# Patient Record
Sex: Female | Born: 1955 | Race: Black or African American | Hispanic: No | Marital: Married | Smoking: Current some day smoker
Health system: Southern US, Community
[De-identification: ages and names within clinical notes are randomized; demographics above are authoritative.]

## PROBLEM LIST (undated history)

## (undated) DIAGNOSIS — E7800 Pure hypercholesterolemia, unspecified: Secondary | ICD-10-CM

## (undated) DIAGNOSIS — M1990 Unspecified osteoarthritis, unspecified site: Secondary | ICD-10-CM

## (undated) DIAGNOSIS — N3281 Overactive bladder: Secondary | ICD-10-CM

## (undated) DIAGNOSIS — E559 Vitamin D deficiency, unspecified: Secondary | ICD-10-CM

## (undated) DIAGNOSIS — I10 Essential (primary) hypertension: Secondary | ICD-10-CM

## (undated) DIAGNOSIS — J40 Bronchitis, not specified as acute or chronic: Secondary | ICD-10-CM

## (undated) HISTORY — PX: FRACTURE SURGERY: SHX138

## (undated) HISTORY — PX: KNEE ARTHROSCOPY: SUR90

## (undated) HISTORY — PX: CARPAL TUNNEL RELEASE: SHX101

---

## 2004-07-31 ENCOUNTER — Ambulatory Visit: Payer: Self-pay | Admitting: Family Medicine

## 2007-05-27 ENCOUNTER — Ambulatory Visit: Payer: Self-pay | Admitting: Gastroenterology

## 2007-06-23 ENCOUNTER — Ambulatory Visit: Payer: Self-pay | Admitting: *Deleted

## 2007-07-04 ENCOUNTER — Ambulatory Visit: Payer: Self-pay | Admitting: *Deleted

## 2007-07-08 ENCOUNTER — Emergency Department: Payer: Self-pay | Admitting: Internal Medicine

## 2007-07-10 ENCOUNTER — Emergency Department: Payer: Self-pay | Admitting: Emergency Medicine

## 2010-11-24 ENCOUNTER — Ambulatory Visit: Payer: Self-pay | Admitting: Specialist

## 2010-12-14 ENCOUNTER — Ambulatory Visit: Payer: Self-pay | Admitting: Specialist

## 2011-01-25 ENCOUNTER — Ambulatory Visit: Payer: Self-pay | Admitting: Specialist

## 2012-07-09 ENCOUNTER — Ambulatory Visit: Payer: Self-pay | Admitting: Family Medicine

## 2014-08-31 ENCOUNTER — Ambulatory Visit
Admit: 2014-08-31 | Disposition: A | Discharge status: Home / Self Care | Payer: Self-pay | Attending: Hematology and Oncology | Admitting: Hematology and Oncology

## 2014-09-07 ENCOUNTER — Ambulatory Visit
Admit: 2014-09-07 | Disposition: A | Discharge status: Home / Self Care | Payer: Self-pay | Attending: Hematology and Oncology | Admitting: Hematology and Oncology

## 2014-10-01 ENCOUNTER — Ambulatory Visit
Admit: 2014-10-01 | Disposition: A | Discharge status: Home / Self Care | Payer: Self-pay | Attending: Hematology and Oncology | Admitting: Hematology and Oncology

## 2014-10-08 LAB — CBC CANCER CENTER
Basophil #: 0.1 x10 3/mm (ref 0.0–0.1)
Basophil %: 1 %
Eosinophil #: 0.4 x10 3/mm (ref 0.0–0.7)
Eosinophil %: 3.6 %
HCT: 45.5 % (ref 35.0–47.0)
HGB: 15.2 g/dL (ref 12.0–16.0)
Lymphocyte #: 2.8 x10 3/mm (ref 1.0–3.6)
Lymphocyte %: 27 %
MCH: 30.2 pg (ref 26.0–34.0)
MCHC: 33.5 g/dL (ref 32.0–36.0)
MCV: 90 fL (ref 80–100)
Monocyte #: 1 x10 3/mm — ABNORMAL HIGH (ref 0.2–0.9)
Monocyte %: 9.8 %
Neutrophil #: 6.1 x10 3/mm (ref 1.4–6.5)
Neutrophil %: 58.6 %
Platelet: 247 x10 3/mm (ref 150–440)
RBC: 5.05 10*6/uL (ref 3.80–5.20)
RDW: 13.4 % (ref 11.5–14.5)
WBC: 10.4 x10 3/mm (ref 3.6–11.0)

## 2014-10-08 LAB — URINALYSIS, COMPLETE
Bilirubin,UR: NEGATIVE
Glucose,UR: NEGATIVE mg/dL (ref 0–75)
Ketone: NEGATIVE
Leukocyte Esterase: NEGATIVE
Nitrite: NEGATIVE
Ph: 7 (ref 4.5–8.0)
Protein: 30
Specific Gravity: 1.008 (ref 1.003–1.030)

## 2016-01-06 ENCOUNTER — Other Ambulatory Visit: Payer: Self-pay | Admitting: Nurse Practitioner

## 2016-01-06 DIAGNOSIS — Z1231 Encounter for screening mammogram for malignant neoplasm of breast: Secondary | ICD-10-CM

## 2016-01-19 ENCOUNTER — Other Ambulatory Visit: Payer: Self-pay | Admitting: Nurse Practitioner

## 2016-01-19 ENCOUNTER — Ambulatory Visit
Admission: RE | Admit: 2016-01-19 | Discharge: 2016-01-19 | Disposition: A | Discharge status: Home / Self Care | Payer: BC Managed Care – PPO | Source: Ambulatory Visit | Attending: Nurse Practitioner | Admitting: Nurse Practitioner

## 2016-01-19 DIAGNOSIS — Z1231 Encounter for screening mammogram for malignant neoplasm of breast: Secondary | ICD-10-CM

## 2016-01-20 ENCOUNTER — Ambulatory Visit
Admission: RE | Admit: 2016-01-20 | Discharge: 2016-01-20 | Disposition: A | Discharge status: Home / Self Care | Payer: BC Managed Care – PPO | Source: Ambulatory Visit | Attending: Obstetrics & Gynecology | Admitting: Obstetrics & Gynecology

## 2016-01-20 ENCOUNTER — Other Ambulatory Visit: Payer: Self-pay

## 2016-01-20 ENCOUNTER — Encounter
Admission: RE | Admit: 2016-01-20 | Discharge: 2016-01-20 | Disposition: A | Discharge status: Home / Self Care | Payer: BC Managed Care – PPO | Source: Ambulatory Visit | Attending: Obstetrics & Gynecology | Admitting: Obstetrics & Gynecology

## 2016-01-20 DIAGNOSIS — Z01812 Encounter for preprocedural laboratory examination: Secondary | ICD-10-CM | POA: Insufficient documentation

## 2016-01-20 DIAGNOSIS — I10 Essential (primary) hypertension: Secondary | ICD-10-CM | POA: Insufficient documentation

## 2016-01-20 DIAGNOSIS — Z01810 Encounter for preprocedural cardiovascular examination: Secondary | ICD-10-CM | POA: Insufficient documentation

## 2016-01-20 DIAGNOSIS — N812 Incomplete uterovaginal prolapse: Secondary | ICD-10-CM | POA: Insufficient documentation

## 2016-01-20 DIAGNOSIS — E669 Obesity, unspecified: Secondary | ICD-10-CM | POA: Diagnosis not present

## 2016-01-20 DIAGNOSIS — N816 Rectocele: Secondary | ICD-10-CM | POA: Insufficient documentation

## 2016-01-20 DIAGNOSIS — N3946 Mixed incontinence: Secondary | ICD-10-CM | POA: Diagnosis present

## 2016-01-20 DIAGNOSIS — F17200 Nicotine dependence, unspecified, uncomplicated: Secondary | ICD-10-CM

## 2016-01-20 HISTORY — DX: Vitamin D deficiency, unspecified: E55.9

## 2016-01-20 HISTORY — DX: Essential (primary) hypertension: I10

## 2016-01-20 HISTORY — DX: Overactive bladder: N32.81

## 2016-01-20 HISTORY — DX: Bronchitis, not specified as acute or chronic: J40

## 2016-01-20 HISTORY — DX: Unspecified osteoarthritis, unspecified site: M19.90

## 2016-01-20 HISTORY — DX: Pure hypercholesterolemia, unspecified: E78.00

## 2016-01-20 LAB — TYPE AND SCREEN
ABO/RH(D): A POS
Antibody Screen: NEGATIVE

## 2016-01-20 LAB — CBC
HEMATOCRIT: 50 % — AB (ref 35.0–47.0)
Hemoglobin: 17 g/dL — ABNORMAL HIGH (ref 12.0–16.0)
MCH: 30.6 pg (ref 26.0–34.0)
MCHC: 34 g/dL (ref 32.0–36.0)
MCV: 90 fL (ref 80.0–100.0)
Platelets: 243 10*3/uL (ref 150–440)
RBC: 5.55 MIL/uL — AB (ref 3.80–5.20)
RDW: 14.6 % — ABNORMAL HIGH (ref 11.5–14.5)
WBC: 9.2 10*3/uL (ref 3.6–11.0)

## 2016-01-20 LAB — BASIC METABOLIC PANEL
ANION GAP: 11 (ref 5–15)
BUN: 14 mg/dL (ref 6–20)
CO2: 26 mmol/L (ref 22–32)
Calcium: 10.2 mg/dL (ref 8.9–10.3)
Chloride: 101 mmol/L (ref 101–111)
Creatinine, Ser: 0.92 mg/dL (ref 0.44–1.00)
GFR calc Af Amer: >60 mL/min (ref >60)
GLUCOSE: 93 mg/dL (ref 65–99)
POTASSIUM: 3.2 mmol/L — AB (ref 3.5–5.1)
Sodium: 138 mmol/L (ref 135–145)

## 2016-01-20 LAB — PROTIME-INR
INR: 1.01
Prothrombin Time: 13.5 seconds (ref 11.4–15.0)

## 2016-01-20 LAB — APTT: APTT: 37 s — AB (ref 24–36)

## 2016-01-20 NOTE — Pre-Procedure Instructions (Signed)
SEPTAL INFARCT NOTED ON PREVIOUS EKG FROM 2012. REVIEWED WITH DR Amie Critchley,. NO NEW ORDERS

## 2016-01-20 NOTE — Patient Instructions (Signed)
Your procedure is scheduled on: Thursday 01/26/16 Report to Day Surgery. 2ND FLOOR MEDICAL MALL ENTRANCE To find out your arrival time please call [phone] between 1PM - 3PM on Wednesday 01/25/16.  Remember: Instructions that are not followed completely may result in serious medical risk, up to and including death, or upon the discretion of your surgeon and anesthesiologist your surgery may need to be rescheduled.    __X__ 1. Do not eat food or drink liquids after midnight. No gum chewing or hard candies.     __X__ 2. No Alcohol for 24 hours before or after surgery.   ____ 3. Bring all medications with you on the day of surgery if instructed.    __X__ 4. Notify your doctor if there is any change in your medical condition     (cold, fever, infections).     Do not wear jewelry, make-up, hairpins, clips or nail polish.  Do not wear lotions, powders, or perfumes.   Do not shave 48 hours prior to surgery. Men may shave face and neck.  Do not bring valuables to the hospital.    [facility] is not responsible for any belongings or valuables.               Contacts, dentures or bridgework may not be worn into surgery.  Leave your suitcase in the car. After surgery it may be brought to your room.  For patients admitted to the hospital, discharge time is determined by your                treatment team.   Patients discharged the day of surgery will not be allowed to drive home.   Please read over the following fact sheets that you were given:   Surgical Site Infection Prevention   __X__ Take these medicines the morning of surgery with A SIP OF WATER:    1. ENALAPRIL  2.   3.   4.  5.  6.  ____ Fleet Enema (as directed)   __X__ Use CHG Soap as directed  __X__ Use inhalers on the day of surgery  ____ Stop metformin 2 days prior to surgery    ____ Take 1/2 of usual insulin dose the night before surgery and none on the morning of surgery.   __X__ Stop Coumadin/Plavix/aspirin on  TODAY  __X__ Stop Anti-inflammatories on STOP MELOXICAM TODAY MAY USE TYLENOL    ____ Stop supplements until after surgery.    ____ Bring C-Pap to the hospital.

## 2016-01-26 ENCOUNTER — Encounter
Admission: RE | Disposition: A | Discharge status: Home / Self Care | Payer: Self-pay | Source: Ambulatory Visit | Attending: Obstetrics & Gynecology

## 2016-01-26 ENCOUNTER — Ambulatory Visit
Admission: RE | Admit: 2016-01-26 | Discharge: 2016-01-26 | Disposition: A | Discharge status: Home / Self Care | Payer: BC Managed Care – PPO | Source: Ambulatory Visit | Attending: Obstetrics & Gynecology | Admitting: Obstetrics & Gynecology

## 2016-01-26 ENCOUNTER — Ambulatory Visit: Payer: BC Managed Care – PPO | Admitting: Anesthesiology

## 2016-01-26 ENCOUNTER — Encounter: Payer: Self-pay | Admitting: *Deleted

## 2016-01-26 DIAGNOSIS — I10 Essential (primary) hypertension: Secondary | ICD-10-CM | POA: Diagnosis not present

## 2016-01-26 DIAGNOSIS — N72 Inflammatory disease of cervix uteri: Secondary | ICD-10-CM | POA: Insufficient documentation

## 2016-01-26 DIAGNOSIS — N888 Other specified noninflammatory disorders of cervix uteri: Secondary | ICD-10-CM | POA: Insufficient documentation

## 2016-01-26 DIAGNOSIS — Z79899 Other long term (current) drug therapy: Secondary | ICD-10-CM | POA: Diagnosis not present

## 2016-01-26 DIAGNOSIS — M1990 Unspecified osteoarthritis, unspecified site: Secondary | ICD-10-CM | POA: Diagnosis not present

## 2016-01-26 DIAGNOSIS — R3915 Urgency of urination: Secondary | ICD-10-CM | POA: Diagnosis not present

## 2016-01-26 DIAGNOSIS — R350 Frequency of micturition: Secondary | ICD-10-CM | POA: Insufficient documentation

## 2016-01-26 DIAGNOSIS — J449 Chronic obstructive pulmonary disease, unspecified: Secondary | ICD-10-CM | POA: Diagnosis not present

## 2016-01-26 DIAGNOSIS — R351 Nocturia: Secondary | ICD-10-CM | POA: Insufficient documentation

## 2016-01-26 DIAGNOSIS — Z803 Family history of malignant neoplasm of breast: Secondary | ICD-10-CM | POA: Diagnosis not present

## 2016-01-26 DIAGNOSIS — N819 Female genital prolapse, unspecified: Secondary | ICD-10-CM | POA: Diagnosis present

## 2016-01-26 DIAGNOSIS — E559 Vitamin D deficiency, unspecified: Secondary | ICD-10-CM | POA: Insufficient documentation

## 2016-01-26 DIAGNOSIS — Z6832 Body mass index (BMI) 32.0-32.9, adult: Secondary | ICD-10-CM | POA: Diagnosis not present

## 2016-01-26 DIAGNOSIS — D252 Subserosal leiomyoma of uterus: Secondary | ICD-10-CM | POA: Diagnosis not present

## 2016-01-26 DIAGNOSIS — N814 Uterovaginal prolapse, unspecified: Secondary | ICD-10-CM | POA: Insufficient documentation

## 2016-01-26 DIAGNOSIS — N3281 Overactive bladder: Secondary | ICD-10-CM | POA: Insufficient documentation

## 2016-01-26 DIAGNOSIS — N393 Stress incontinence (female) (male): Secondary | ICD-10-CM | POA: Insufficient documentation

## 2016-01-26 DIAGNOSIS — E7800 Pure hypercholesterolemia, unspecified: Secondary | ICD-10-CM | POA: Diagnosis not present

## 2016-01-26 DIAGNOSIS — E669 Obesity, unspecified: Secondary | ICD-10-CM | POA: Diagnosis not present

## 2016-01-26 HISTORY — PX: CYSTOSCOPY: SHX5120

## 2016-01-26 HISTORY — PX: ANTERIOR AND POSTERIOR REPAIR WITH SACROSPINOUS FIXATION: SHX6536

## 2016-01-26 HISTORY — PX: VAGINAL HYSTERECTOMY: SHX2639

## 2016-01-26 HISTORY — PX: PUBOVAGINAL SLING: SHX1035

## 2016-01-26 LAB — POCT I-STAT 4, (NA,K, GLUC, HGB,HCT)
GLUCOSE: 101 mg/dL — AB (ref 65–99)
HEMATOCRIT: 48 % — AB (ref 36.0–46.0)
HEMOGLOBIN: 16.3 g/dL — AB (ref 12.0–15.0)
POTASSIUM: 4 mmol/L (ref 3.5–5.1)
SODIUM: 141 mmol/L (ref 135–145)

## 2016-01-26 SURGERY — HYSTERECTOMY, VAGINAL
Anesthesia: General

## 2016-01-26 MED ORDER — ACETAMINOPHEN 650 MG RE SUPP
650.0000 mg | RECTAL | Status: DC | PRN
  Filled 2016-01-26: qty 1

## 2016-01-26 MED ORDER — PROPOFOL 10 MG/ML IV BOLUS
INTRAVENOUS | Status: DC | PRN
  Administered 2016-01-26: 120 mg via INTRAVENOUS

## 2016-01-26 MED ORDER — OXYCODONE HCL 5 MG PO TABS
5.0000 mg | ORAL_TABLET | Freq: Once | ORAL | Status: AC | PRN
  Administered 2016-01-26: 5 mg via ORAL

## 2016-01-26 MED ORDER — MEPERIDINE HCL 25 MG/ML IJ SOLN: 6.2500 mg | INTRAMUSCULAR | Status: DC | PRN

## 2016-01-26 MED ORDER — SULFANILAMIDE 15 % VA CREA
TOPICAL_CREAM | VAGINAL | Status: DC | PRN
  Administered 2016-01-26: 1 via VAGINAL

## 2016-01-26 MED ORDER — KETOROLAC TROMETHAMINE 30 MG/ML IJ SOLN
30.0000 mg | Freq: Four times a day (QID) | INTRAMUSCULAR | Status: DC
  Filled 2016-01-26 (×6): qty 1

## 2016-01-26 MED ORDER — FENTANYL CITRATE (PF) 100 MCG/2ML IJ SOLN
25.0000 ug | INTRAMUSCULAR | Status: DC | PRN
  Administered 2016-01-26 (×2): 25 ug via INTRAVENOUS

## 2016-01-26 MED ORDER — DEXAMETHASONE SODIUM PHOSPHATE 10 MG/ML IJ SOLN
INTRAMUSCULAR | Status: DC | PRN
  Administered 2016-01-26: 5 mg via INTRAVENOUS

## 2016-01-26 MED ORDER — ACETAMINOPHEN 10 MG/ML IV SOLN
INTRAVENOUS | Status: DC | PRN
  Administered 2016-01-26: 1000 mg via INTRAVENOUS

## 2016-01-26 MED ORDER — OXYCODONE HCL 5 MG/5ML PO SOLN: 5.0000 mg | Freq: Once | ORAL | Status: AC | PRN

## 2016-01-26 MED ORDER — OXYCODONE-ACETAMINOPHEN 5-325 MG PO TABS: 1.0000 | ORAL_TABLET | ORAL | 0 refills | Status: AC | PRN

## 2016-01-26 MED ORDER — CEFOXITIN SODIUM-DEXTROSE 2-2.2 GM-% IV SOLR (PREMIX)
2.0000 g | Freq: Once | INTRAVENOUS | Status: AC
  Administered 2016-01-26: 2 g via INTRAVENOUS

## 2016-01-26 MED ORDER — MIDAZOLAM HCL 2 MG/2ML IJ SOLN
INTRAMUSCULAR | Status: DC | PRN
  Administered 2016-01-26: 2 mg via INTRAVENOUS

## 2016-01-26 MED ORDER — CEFOXITIN SODIUM-DEXTROSE 2-2.2 GM-% IV SOLR (PREMIX)
INTRAVENOUS | Status: AC
  Filled 2016-01-26: qty 50

## 2016-01-26 MED ORDER — SULFANILAMIDE 15 % VA CREA
TOPICAL_CREAM | VAGINAL | Status: AC
  Filled 2016-01-26: qty 120

## 2016-01-26 MED ORDER — LACTATED RINGERS IV SOLN
INTRAVENOUS | Status: DC
  Administered 2016-01-26 (×3): via INTRAVENOUS

## 2016-01-26 MED ORDER — FAMOTIDINE 20 MG PO TABS
ORAL_TABLET | ORAL | Status: AC
  Administered 2016-01-26: 20 mg via ORAL
  Filled 2016-01-26: qty 1

## 2016-01-26 MED ORDER — FENTANYL CITRATE (PF) 100 MCG/2ML IJ SOLN
INTRAMUSCULAR | Status: AC
  Administered 2016-01-26: 25 ug via INTRAVENOUS
  Filled 2016-01-26: qty 2

## 2016-01-26 MED ORDER — ACETAMINOPHEN 10 MG/ML IV SOLN
INTRAVENOUS | Status: AC
  Filled 2016-01-26: qty 100

## 2016-01-26 MED ORDER — LIDOCAINE-EPINEPHRINE 1 %-1:100000 IJ SOLN
INTRAMUSCULAR | Status: DC | PRN
  Administered 2016-01-26: 40 mL

## 2016-01-26 MED ORDER — ACETAMINOPHEN 325 MG PO TABS: 650.0000 mg | ORAL_TABLET | ORAL | Status: DC | PRN

## 2016-01-26 MED ORDER — PROMETHAZINE HCL 25 MG/ML IJ SOLN: 6.2500 mg | INTRAMUSCULAR | Status: DC | PRN

## 2016-01-26 MED ORDER — FAMOTIDINE 20 MG PO TABS
20.0000 mg | ORAL_TABLET | Freq: Once | ORAL | Status: AC
  Administered 2016-01-26: 20 mg via ORAL

## 2016-01-26 MED ORDER — LIDOCAINE-EPINEPHRINE 1 %-1:100000 IJ SOLN
INTRAMUSCULAR | Status: AC
  Filled 2016-01-26: qty 2

## 2016-01-26 MED ORDER — ONDANSETRON HCL 4 MG/2ML IJ SOLN
INTRAMUSCULAR | Status: DC | PRN
  Administered 2016-01-26: 4 mg via INTRAVENOUS

## 2016-01-26 MED ORDER — MORPHINE SULFATE (PF) 2 MG/ML IV SOLN: 1.0000 mg | INTRAVENOUS | Status: DC | PRN

## 2016-01-26 MED ORDER — OXYCODONE HCL 5 MG PO TABS
ORAL_TABLET | ORAL | Status: AC
  Filled 2016-01-26: qty 1

## 2016-01-26 MED ORDER — KETOROLAC TROMETHAMINE 30 MG/ML IJ SOLN
INTRAMUSCULAR | Status: DC | PRN
  Administered 2016-01-26: 30 mg via INTRAVENOUS

## 2016-01-26 MED ORDER — FENTANYL CITRATE (PF) 100 MCG/2ML IJ SOLN
INTRAMUSCULAR | Status: DC | PRN
  Administered 2016-01-26: 50 ug via INTRAVENOUS
  Administered 2016-01-26: 100 ug via INTRAVENOUS
  Administered 2016-01-26 (×2): 50 ug via INTRAVENOUS

## 2016-01-26 MED ORDER — LIDOCAINE HCL (CARDIAC) 20 MG/ML IV SOLN
INTRAVENOUS | Status: DC | PRN
  Administered 2016-01-26: 30 mg via INTRAVENOUS

## 2016-01-26 SURGICAL SUPPLY — 62 items
BAG URINE DRAINAGE (UROLOGICAL SUPPLIES) ×4 IMPLANT
BAG URO DRAIN 2000ML W/SPOUT (MISCELLANEOUS) ×4 IMPLANT
BLADE SURG 15 STRL LF DISP TIS (BLADE) ×2 IMPLANT
BLADE SURG 15 STRL SS (BLADE) ×2
BLADE SURG SZ10 CARB STEEL (BLADE) IMPLANT
BNDG GAUZE 4.5X4.1 6PLY STRL (MISCELLANEOUS) IMPLANT
CANISTER SUCT 1200ML W/VALVE (MISCELLANEOUS) ×4 IMPLANT
CATH FOLEY 2WAY  5CC 16FR (CATHETERS) ×2
CATH ROBINSON RED A/P 16FR (CATHETERS) IMPLANT
CATH URTH 16FR FL 2W BLN LF (CATHETERS) ×2 IMPLANT
DRAPE LAPAROTOMY 100X77 ABD (DRAPES) IMPLANT
DRAPE PERI LITHO V/GYN (MISCELLANEOUS) ×4 IMPLANT
DRAPE SHEET LG 3/4 BI-LAMINATE (DRAPES) ×4 IMPLANT
DRAPE UNDER BUTTOCK W/FLU (DRAPES) ×4 IMPLANT
ELECT CAUTERY BLADE 6.4 (BLADE) IMPLANT
ELECT REM PT RETURN 9FT ADLT (ELECTROSURGICAL) ×4
ELECTRODE REM PT RTRN 9FT ADLT (ELECTROSURGICAL) ×2 IMPLANT
GAUZE PACK 2X3YD (MISCELLANEOUS) ×4 IMPLANT
GLOVE BIO SURGEON STRL SZ8 (GLOVE) ×32 IMPLANT
GOWN STRL REUS W/ TWL LRG LVL3 (GOWN DISPOSABLE) ×6 IMPLANT
GOWN STRL REUS W/ TWL XL LVL3 (GOWN DISPOSABLE) ×2 IMPLANT
GOWN STRL REUS W/TWL LRG LVL3 (GOWN DISPOSABLE) ×6
GOWN STRL REUS W/TWL XL LVL3 (GOWN DISPOSABLE) ×2
IV LACTATED RINGERS 1000ML (IV SOLUTION) ×4 IMPLANT
KIT RM TURNOVER CYSTO AR (KITS) ×4 IMPLANT
KIT RM TURNOVER STRD PROC AR (KITS) IMPLANT
LABEL OR SOLS (LABEL) ×4 IMPLANT
LIQUID BAND (GAUZE/BANDAGES/DRESSINGS) ×4 IMPLANT
NDL HPO THNWL 1X22GA REG BVL (NEEDLE) ×2 IMPLANT
NDL MAYO CATGUT SZ4 (NEEDLE) IMPLANT
NDL SAFETY 18GX1.5 (NEEDLE) IMPLANT
NDL SAFETY 22GX1.5 (NEEDLE) ×4 IMPLANT
NEEDLE SAFETY 22GX1 (NEEDLE) ×2
NEEDLE SPNL 22GX3.5 QUINCKE BK (NEEDLE) ×4 IMPLANT
NS IRRIG 500ML POUR BTL (IV SOLUTION) ×4 IMPLANT
PACK BASIN MINOR ARMC (MISCELLANEOUS) ×4 IMPLANT
PAD OB MATERNITY 4.3X12.25 (PERSONAL CARE ITEMS) ×4 IMPLANT
PAD PREP 24X41 OB/GYN DISP (PERSONAL CARE ITEMS) ×4 IMPLANT
SET CYSTO W/LG BORE CLAMP LF (SET/KITS/TRAYS/PACK) ×4 IMPLANT
SLING TVT EXACT (Sling) ×4 IMPLANT
SOL PREP PVP 2OZ (MISCELLANEOUS) ×4
SOLUTION PREP PVP 2OZ (MISCELLANEOUS) ×2 IMPLANT
SPONGE XRAY 4X4 16PLY STRL (MISCELLANEOUS) ×8 IMPLANT
STRAP SAFETY BODY (MISCELLANEOUS) ×4 IMPLANT
SURGILUBE 2OZ TUBE FLIPTOP (MISCELLANEOUS) ×4 IMPLANT
SUT ETHIBOND NAB CT1 #1 30IN (SUTURE) ×8 IMPLANT
SUT VIC AB 0 CT1 27 (SUTURE) ×4
SUT VIC AB 0 CT1 27XCR 8 STRN (SUTURE) ×4 IMPLANT
SUT VIC AB 0 CT1 36 (SUTURE) ×8 IMPLANT
SUT VIC AB 1 CT1 36 (SUTURE) ×4 IMPLANT
SUT VIC AB 2-0 CT1 (SUTURE) IMPLANT
SUT VIC AB 2-0 CT1 27 (SUTURE)
SUT VIC AB 2-0 CT1 TAPERPNT 27 (SUTURE) IMPLANT
SUT VIC AB 3-0 SH 27 (SUTURE)
SUT VIC AB 3-0 SH 27X BRD (SUTURE) IMPLANT
SUT VICRYL+ 3-0 36IN CT-1 (SUTURE) IMPLANT
SYR BULB IRRIG 60ML STRL (SYRINGE) ×4 IMPLANT
SYR CONTROL 10ML (SYRINGE) ×4 IMPLANT
SYRINGE 10CC LL (SYRINGE) ×4 IMPLANT
SYRINGE IRR TOOMEY STRL 70CC (SYRINGE) ×4 IMPLANT
TUBING CONNECTING 10 (TUBING) ×3 IMPLANT
TUBING CONNECTING 10' (TUBING) ×1

## 2016-01-26 NOTE — Transfer of Care (Signed)
Immediate Anesthesia Transfer of Care Note  Patient: Alison Gonzalez  Procedure(s) Performed: Procedure(s): HYSTERECTOMY VAGINAL/ BSO (Bilateral) ANTERIOR AND POSTERIOR REPAIR (N/A) PUBO-VAGINAL SLING (N/A) CYSTOSCOPY (N/A)  Patient Location: PACU  Anesthesia Type:General  Level of Consciousness: awake, alert  and oriented  Airway & Oxygen Therapy: Patient Spontanous Breathing and Patient connected to face mask oxygen  Post-op Assessment: Report given to RN and Post -op Vital signs reviewed and stable  Post vital signs: Reviewed and stable  Last Vitals:  Vitals:   01/26/16 1107  BP: (!) 134/95  Pulse: 84  Resp: 16  Temp: 36.9 C    Last Pain:  Vitals:   01/26/16 1107  TempSrc: Oral         Complications: No apparent anesthesia complications

## 2016-01-26 NOTE — OR Nursing (Signed)
Foley catheter removed without difficulty after instilling 3100ml normal saline into bladder per Dr Kenton Kingfisher.

## 2016-01-26 NOTE — Discharge Instructions (Signed)
AMBULATORY SURGERY  DISCHARGE INSTRUCTIONS   1) The drugs that you were given will stay in your system until tomorrow so for the next 24 hours you should not:  A) Drive an automobile B) Make any legal decisions C) Drink any alcoholic beverage   2) You may resume regular meals tomorrow.  Today it is better to start with liquids and gradually work up to solid foods.  You may eat anything you prefer, but it is better to start with liquids, then soup and crackers, and gradually work up to solid foods.   3) Please notify your doctor immediately if you have any unusual bleeding, trouble breathing, redness and pain at the surgery site, drainage, fever, or pain not relieved by medication.    4) Additional Instructions:        Please contact your physician with any problems or Same Day Surgery at [phone], Monday through Friday 6 am to 4 pm, or [place] [facility] number at [phone].Hysterectomy Information  A hysterectomy is a surgery in which your uterus is removed. This surgery may be done to treat various medical problems. After the surgery, you will no longer have menstrual periods. The surgery will also make you unable to become pregnant (sterile). The fallopian tubes and ovaries can be removed (bilateral salpingo-oophorectomy) during this surgery as well.  REASONS FOR A HYSTERECTOMY  Persistent, abnormal bleeding.  Lasting (chronic) pelvic pain or infection.  The lining of the uterus (endometrium) starts growing outside the uterus (endometriosis).  The endometrium starts growing in the muscle of the uterus (adenomyosis).  The uterus falls down into the vagina (pelvic organ prolapse).  Noncancerous growths in the uterus (uterine fibroids) that cause symptoms.  Precancerous cells.  Cervical cancer or uterine cancer. TYPES OF HYSTERECTOMIES  Supracervical hysterectomy--In this type, the top part of the uterus is removed, but not the cervix.  Total  hysterectomy--The uterus and cervix are removed.  Radical hysterectomy--The uterus, the cervix, and the fibrous tissue that holds the uterus in place in the pelvis (parametrium) are removed. WAYS A HYSTERECTOMY CAN BE PERFORMED  Abdominal hysterectomy--A large surgical cut (incision) is made in the abdomen. The uterus is removed through this incision.  Vaginal hysterectomy--An incision is made in the vagina. The uterus is removed through this incision. There are no abdominal incisions.  Conventional laparoscopic hysterectomy--Three or four small incisions are made in the abdomen. A thin, lighted tube with a camera (laparoscope) is inserted into one of the incisions. Other tools are put through the other incisions. The uterus is cut into small pieces. The small pieces are removed through the incisions, or they are removed through the vagina.  Laparoscopically assisted vaginal hysterectomy (LAVH)--Three or four small incisions are made in the abdomen. Part of the surgery is performed laparoscopically and part vaginally. The uterus is removed through the vagina.  Robot-assisted laparoscopic hysterectomy--A laparoscope and other tools are inserted into 3 or 4 small incisions in the abdomen. A computer-controlled device is used to give the surgeon a 3D image and to help control the surgical instruments. This allows for more precise movements of surgical instruments. The uterus is cut into small pieces and removed through the incisions or removed through the vagina. RISKS AND COMPLICATIONS  Possible complications associated with this procedure include:  Bleeding and risk of blood transfusion. Tell your health care provider if you do not want to receive any blood products.  Blood clots in the legs or lung.  Infection.  Injury to surrounding organs.  Problems or side effects related to anesthesia.  Conversion to an abdominal hysterectomy from one of the other techniques. WHAT TO EXPECT AFTER A  HYSTERECTOMY  You will be given pain medicine.  You will need to have someone with you for the first 3-5 days after you go home.  You will need to follow up with your surgeon in 2-4 weeks after surgery to evaluate your progress.  You may have early menopause symptoms such as hot flashes, night sweats, and insomnia.  If you had a hysterectomy for a problem that was not cancer or not a condition that could lead to cancer, then you no longer need Pap tests. However, even if you no longer need a Pap test, a regular exam is a good idea to make sure no other problems are starting.   This information is not intended to replace advice given to you by your health care provider. Make sure you discuss any questions you have with your health care provider.   Document Released: 12/12/2000 Document Revised: 04/08/2013 Document Reviewed: 02/23/2013 Elsevier Interactive Patient Education [employer].

## 2016-01-26 NOTE — Anesthesia Preprocedure Evaluation (Signed)
Anesthesia Evaluation  Patient identified by MRN, date of birth, ID band Patient awake    Reviewed: Allergy & Precautions, NPO status , Patient's Chart, lab work & pertinent test results  History of Anesthesia Complications Negative for: history of anesthetic complications  Airway Mallampati: I  TM Distance: >3 FB Neck ROM: Full    Dental  (+) Missing, Poor Dentition   Pulmonary COPD,  COPD inhaler, Current Smoker,    breath sounds clear to auscultation- rhonchi (-) wheezing      Cardiovascular Exercise Tolerance: Good hypertension, Pt. on medications (-) CAD and (-) Past MI  Rhythm:Regular Rate:Normal - Systolic murmurs and - Diastolic murmurs    Neuro/Psych negative psych ROS   GI/Hepatic negative GI ROS, Neg liver ROS,   Endo/Other  negative endocrine ROSneg diabetes  Renal/GU negative Renal ROS     Musculoskeletal  (+) Arthritis , Osteoarthritis,    Abdominal (+) + obese,   Peds  Hematology negative hematology ROS (+)   Anesthesia Other Findings Past Medical History: No date: Arthritis No date: Bronchitis No date: Hypercholesteremia No date: Hypertension No date: Overactive bladder No date: Vitamin D deficiency   Reproductive/Obstetrics                             Anesthesia Physical Anesthesia Plan  ASA: II  Anesthesia Plan: General   Post-op Pain Management:    Induction: Intravenous  Airway Management Planned: LMA  Additional Equipment:   Intra-op Plan:   Post-operative Plan:   Informed Consent: I have reviewed the patients History and Physical, chart, labs and discussed the procedure including the risks, benefits and alternatives for the proposed anesthesia with the patient or authorized representative who has indicated his/her understanding and acceptance.   Dental advisory given  Plan Discussed with: Anesthesiologist and CRNA  Anesthesia Plan Comments:          Anesthesia Quick Evaluation

## 2016-01-26 NOTE — H&P (Signed)
History and Physical Interval Note:  01/26/2016 11:51 AM  Alison Gonzalez  has presented today for surgery, with the diagnosis of PELVIC ORGAN PROLAPSE,STRESS URINARY INCONTINENCE,CYSTOCELE,RECTOCELE  The various methods of treatment have been discussed with the patient and family. After consideration of risks, benefits and other options for treatment, the patient has consented to  Procedure(s): HYSTERECTOMY VAGINAL/ BSO (Bilateral) ANTERIOR AND POSTERIOR REPAIR (N/A) PUBO-VAGINAL SLING (N/A) CYSTOSCOPY (N/A) as a surgical intervention .  The patient's history has been reviewed, patient examined, no change in status, stable for surgery.  Pt has the following beta blocker history-  Not taking Beta Blocker.  I have reviewed the patient's chart and labs.  Questions were answered to the patient's satisfaction.    Hoyt Koch

## 2016-01-26 NOTE — OR Nursing (Signed)
Pt voided 371ml clear yellow urine.

## 2016-01-26 NOTE — Op Note (Signed)
Preoperative diagnosis: Pelvic Organ Prolapse, Stress Urinary Incontinence, Cystocele, Rectocele  Postoperative diagnosis: Same  Procedure: Transvaginal hysterectomy, Bilateral Salpingo-Oophorectomy, Anterior and Posterior Repair, Cystoscopy, Vaginal Sling Procedure (TVT)  Surgeon: Barnett Applebaum, M.D.  Anesthesia: general  Findings: Small uterus with fibroid, atrophic ovaries, prolapse and cystocele/rectocele.  Estimated blood loss: 200 cc  Specimen: Uterus and adnexa to pathology   Disposition: Tolerated procedure well  Procedure: Patient was taken to the OR where she was placed in dorsal lithotomy in [place]. She was prepped and draped in the usual sterile fashion. A timeout was performed. Foley is placed into bladder. A speculum was placed inside the vagina. The cervix was visualized and grasped with a tenaculum. 1% lidocaine with epinephrine were injected paracervically. A bovie was used to make a circumferential incision around the vagina. An opened sponge was used to dissect the vagina off the cervix. The posterior peritoneum was entered sharply with Mayo scissors.  The anterior peritoneal cavity was entered sharply with careful dissection of the bladder off the underlying cervix. A Heaney clamp was used to clamp first the left uterosacral ligament and cardinal which was then cut and Haney suture ligated with 0 Vicryl stitch, the stitch was held and later attached to the vaginal mucosa. Similarly the right uterosacral ligament was clamped cut and suture ligated.  Sequential clamping, transecting and suture ligating up the broad to the uterine arteries were taken until the tubo-ovarian pedicles were encountered. The uterus was then amputated. The left utero-ovarian pedicle grasped with a Heaney clamp. The right utero-ovarian pedicle was similarly grasped with the Heaney clamp. The right left ovaries were easily visualized. The left ovary and tube were grasped with Babcock clamp and a  Heaney clamp placed behind this. Tube and ovary were removed and the IP ligated with a free tie followed by suture ligature. The right tube and ovary were similarly grasped with a Babcock clamp and a Heaney clamp used to clamp behind this. The tube and ovary were removed on the side and the IP was ligated with a free tie followed by suture ligature. Inspection of all pedicles revealed adequate hemostasis.  The peritoneum was then closed with a running pursestring suture of 0 Vicryl. The uterosacrals were plicated using a 1-0 Ethibond suture.  Vagina is irrigated. Anterior colporrhaphy is performed.  Allis clamps are placed along the anterior vaginal wall, lidocaine is used to infiltrate the plane, and incision is made midline vertical.  Endopelvic fascia is dissected free of vaginal mucosa.   Before finishing the anterior repair, a vaginal sling procedure is placed.  A rigid cathater guide is placed in the bladder to deviate the bladder to the contralateral side of trocar placement. Lidocaine is used to infiltrate the tract.  The TVT trocars are then placed thru the anterior vaginal incision and thru the retropubic space along the undersurface of the pubic rami to exit the skin in the mons pubis.  Cystoscopy confirms no bladder injury.  Foley replaced.  Sling placed in supportive position without tension.  Fascia is plicated w interrupted vicryl sutures.  Excess mucosa is excised.  Vaginal incision is closed with a running locking Vicryl suture, to incoprate the hysterectomy incision as well, with care taken to incorporate the uterosacral pedicles.  Posterior repair done with incision in midline and dissection of fascia from mucosa.  Perineum repaired.  Mucosa excised.  Plication sutures placed.  Running closure with vicryl including perineal repair. Excellent hemostasis was noted at the end of the case. The  vaginal cuff was inspected there was minimal bleeding noted.  Packing gauze AVC cream is placed. A  Foley catheter is left in  place inside her bladder. Clear, yellow urine was noted. All instrument needle and lap counts were correct x 2. Patient was awakened taken to recovery room in stable condition.  Hoyt Koch, MD 01/26/2016, 2:47 PM

## 2016-01-27 ENCOUNTER — Encounter: Payer: Self-pay | Admitting: Obstetrics & Gynecology

## 2016-01-27 NOTE — Anesthesia Postprocedure Evaluation (Signed)
Anesthesia Post Note  Patient: Alison Gonzalez  Procedure(s) Performed: Procedure(s) (LRB): HYSTERECTOMY VAGINAL/ BSO (Bilateral) ANTERIOR AND POSTERIOR REPAIR (N/A) PUBO-VAGINAL SLING (N/A) CYSTOSCOPY (N/A)  Patient location during evaluation: PACU Anesthesia Type: General Level of consciousness: awake and alert Pain management: pain level controlled Vital Signs Assessment: post-procedure vital signs reviewed and stable Respiratory status: spontaneous breathing, nonlabored ventilation and respiratory function stable Cardiovascular status: blood pressure returned to baseline and stable Postop Assessment: no signs of nausea or vomiting Anesthetic complications: no    Last Vitals:  Vitals:   01/26/16 1718 01/26/16 1753  BP: 111/70 123/80  Pulse: 90   Resp: 16 16  Temp:      Last Pain:  Vitals:   01/27/16 0838  TempSrc:   PainSc: 5                  Terrall Bley

## 2016-01-30 LAB — SURGICAL PATHOLOGY

## 2016-12-24 ENCOUNTER — Other Ambulatory Visit: Payer: Self-pay | Admitting: Nurse Practitioner

## 2016-12-24 DIAGNOSIS — Z1231 Encounter for screening mammogram for malignant neoplasm of breast: Secondary | ICD-10-CM

## 2017-01-21 ENCOUNTER — Ambulatory Visit
Admission: RE | Admit: 2017-01-21 | Discharge: 2017-01-21 | Disposition: A | Discharge status: Home / Self Care | Payer: BC Managed Care – PPO | Source: Ambulatory Visit | Attending: Nurse Practitioner | Admitting: Nurse Practitioner

## 2017-01-21 DIAGNOSIS — Z1231 Encounter for screening mammogram for malignant neoplasm of breast: Secondary | ICD-10-CM | POA: Diagnosis present

## 2017-02-12 ENCOUNTER — Encounter: Payer: Self-pay | Admitting: Obstetrics & Gynecology

## 2017-02-12 ENCOUNTER — Ambulatory Visit (INDEPENDENT_AMBULATORY_CARE_PROVIDER_SITE_OTHER): Payer: BC Managed Care – PPO | Admitting: Obstetrics & Gynecology

## 2017-02-12 VITALS — BP 140/80 | HR 83 | Ht 65.0 in | Wt 183.0 lb

## 2017-02-12 DIAGNOSIS — R3915 Urgency of urination: Secondary | ICD-10-CM | POA: Insufficient documentation

## 2017-02-12 DIAGNOSIS — Z01419 Encounter for gynecological examination (general) (routine) without abnormal findings: Secondary | ICD-10-CM

## 2017-02-12 DIAGNOSIS — Z0000 Encounter for general adult medical examination without abnormal findings: Secondary | ICD-10-CM

## 2017-02-12 NOTE — Progress Notes (Signed)
HPI:      [name] is a 61 y.o. G2P2002 who LMP was in the past, she presents today for her annual examination.  The patient has no complaints today. The patient is not sexually active. Herlast mammogram: was normal.  The patient does perform self breast exams.  There is no notable family history of breast or ovarian cancer in her family. The patient is not taking hormone replacement therapy. Patient denies post-menopausal vaginal bleeding.   The patient has regular exercise: yes. The patient denies current symptoms of depression.  Occas urinary urgency, no nocturia or leakage.    GYN Hx: Last Colonoscopy:8 yrs ago. Normal.  Last DEXA: never ago.    PMHx: Past Medical History:  Diagnosis Date  . Arthritis   . Bronchitis   . Hypercholesteremia   . Hypertension   . Overactive bladder   . Vitamin D deficiency    Past Surgical History:  Procedure Laterality Date  . ANTERIOR AND POSTERIOR REPAIR WITH SACROSPINOUS FIXATION N/A 01/26/2016   Procedure: ANTERIOR AND POSTERIOR REPAIR;  Surgeon: Gae Dry, MD;  Location: [facility] ORS;  Service: Gynecology;  Laterality: N/A;  . CARPAL TUNNEL RELEASE    . CYSTOSCOPY N/A 01/26/2016   Procedure: CYSTOSCOPY;  Surgeon: Gae Dry, MD;  Location: [facility] ORS;  Service: Gynecology;  Laterality: N/A;  . FRACTURE SURGERY    . KNEE ARTHROSCOPY    . PUBOVAGINAL SLING N/A 01/26/2016   Procedure: Gaynelle Arabian;  Surgeon: Gae Dry, MD;  Location: [facility] ORS;  Service: Gynecology;  Laterality: N/A;  . VAGINAL HYSTERECTOMY Bilateral 01/26/2016   Procedure: HYSTERECTOMY VAGINAL/ BSO;  Surgeon: Gae Dry, MD;  Location: [facility] ORS;  Service: Gynecology;  Laterality: Bilateral;   Family History  Problem Relation Age of Onset  . Breast cancer Maternal Aunt   . Breast cancer Maternal Aunt    Social History  Substance Use Topics  . Smoking status: Current Some Day Smoker  . Smokeless tobacco: Never Used  . Alcohol use Yes   Comment: occ socially    Current Outpatient Prescriptions:  .  albuterol (PROVENTIL HFA;VENTOLIN HFA) 108 (90 Base) MCG/ACT inhaler, Inhale 2 puffs into the lungs every 6 (six) hours as needed for wheezing or shortness of breath., Disp: , Rfl:  .  aspirin 81 MG tablet, Take 81 mg by mouth daily., Disp: , Rfl:  .  atorvastatin (LIPITOR) 40 MG tablet, Take 1 tablet by mouth daily., Disp: , Rfl:  .  enalapril (VASOTEC) 20 MG tablet, Take 20 mg by mouth 2 (two) times daily., Disp: , Rfl:  .  hydrochlorothiazide (HYDRODIURIL) 25 MG tablet, Take 1 tablet by mouth daily., Disp: , Rfl:  .  meloxicam (MOBIC) 15 MG tablet, Take 1 tablet by mouth daily as needed for pain. , Disp: , Rfl:  .  oxyCODONE-acetaminophen (PERCOCET) 5-325 MG tablet, Take 1 tablet by mouth every 4 (four) hours as needed for moderate pain or severe pain., Disp: 50 tablet, Rfl: 0 .  VITAMIN D, CHOLECALCIFEROL, PO, Take 1 tablet by mouth daily., Disp: , Rfl:  Allergies: Patient has no known allergies.  Review of Systems  Constitutional: Negative for chills, fever and malaise/fatigue.  HENT: Negative for congestion, sinus pain and sore throat.   Eyes: Negative for blurred vision and pain.  Respiratory: Negative for cough and wheezing.   Cardiovascular: Negative for chest pain and leg swelling.  Gastrointestinal: Negative for abdominal pain, constipation, diarrhea, heartburn, nausea and vomiting.  Genitourinary: Negative  for dysuria, frequency, hematuria and urgency.  Musculoskeletal: Negative for back pain, joint pain, myalgias and neck pain.  Skin: Negative for itching and rash.  Neurological: Negative for dizziness, tremors and weakness.  Endo/Heme/Allergies: Does not bruise/bleed easily.  Psychiatric/Behavioral: Negative for depression. The patient is not nervous/anxious and does not have insomnia.     Objective: BP 140/80   Pulse 83   Ht 5\' 5"  (1.651 m)   Wt 183 lb (83 kg)   BMI 30.45 kg/m   Filed Weights   02/12/17  1035  Weight: 183 lb (83 kg)   Body mass index is 30.45 kg/m. Physical Exam  Constitutional: She is oriented to person, place, and time. She appears well-developed and well-nourished. No distress.  Genitourinary: Rectum normal and vagina normal. Pelvic exam was performed with patient supine. There is no rash or lesion on the right labia. There is no rash or lesion on the left labia. Vagina exhibits no lesion. No bleeding in the vagina. Right adnexum does not display mass and does not display tenderness. Left adnexum does not display mass and does not display tenderness.  Genitourinary Comments: Absent Uterus Absent cervix Vaginal cuff well healed. No palpable sling/erosion.  Some vaginal narrowing at level of sling.  Atrophy  HENT:  Head: Normocephalic and atraumatic. Head is without laceration.  Right Ear: Hearing normal.  Left Ear: Hearing normal.  Nose: No epistaxis.  No foreign bodies.  Mouth/Throat: Uvula is midline, oropharynx is clear and moist and mucous membranes are normal.  Eyes: Pupils are equal, round, and reactive to light.  Neck: Normal range of motion. Neck supple. No thyromegaly present.  Cardiovascular: Normal rate and regular rhythm.  Exam reveals no gallop and no friction rub.   No murmur heard. Pulmonary/Chest: Effort normal and breath sounds normal. No respiratory distress. She has no wheezes. Right breast exhibits no mass, no skin change and no tenderness. Left breast exhibits no mass, no skin change and no tenderness.  Abdominal: Soft. Bowel sounds are normal. She exhibits no distension. There is no tenderness. There is no rebound.  Musculoskeletal: Normal range of motion.  Neurological: She is alert and oriented to person, place, and time. No cranial nerve deficit.  Skin: Skin is warm and dry.  Psychiatric: She has a normal mood and affect. Judgment normal.  Vitals reviewed.   Assessment: Annual Exam 1. Annual physical exam   2. Urgency of urination      Plan:            1.  Cervical Screening-  Pap smear schedule reviewed with patient, due 2022  2. Breast screening- Exam annually and mammogram scheduled  3. Colonoscopy every 10 years, Hemoccult testing after age 28  4. Labs managed by PCP  5. Counseling for hormonal therapy: none  6. Urinary urgency, mild, rare.  Most GU sx's improved since hyst/sling surgery last year.  Monitor for worsening OAB.    F/U  Return in about 1 year (around 02/12/2018) for Annual.  Barnett Applebaum, MD, [facility], [facility] 02/12/2017  11:00 AM

## 2017-08-28 ENCOUNTER — Other Ambulatory Visit: Payer: Self-pay | Admitting: Nurse Practitioner

## 2017-08-28 DIAGNOSIS — Z1231 Encounter for screening mammogram for malignant neoplasm of breast: Secondary | ICD-10-CM

## 2018-01-22 ENCOUNTER — Ambulatory Visit
Admission: RE | Admit: 2018-01-22 | Discharge: 2018-01-22 | Disposition: A | Discharge status: Home / Self Care | Payer: BC Managed Care – PPO | Source: Ambulatory Visit | Attending: Nurse Practitioner | Admitting: Nurse Practitioner

## 2018-01-22 DIAGNOSIS — Z1231 Encounter for screening mammogram for malignant neoplasm of breast: Secondary | ICD-10-CM | POA: Insufficient documentation

## 2018-12-10 IMAGING — MG MM DIGITAL SCREENING BILAT W/ TOMO W/ CAD
8 of 12 series · 8 of 28 positions shown · non-contrast
Comparison: Previous exam(s).

CLINICAL DATA: Screening.

EXAM:
2D DIGITAL SCREENING BILATERAL MAMMOGRAM WITH CAD AND ADJUNCT TOMO

[L MLO synth-2D]
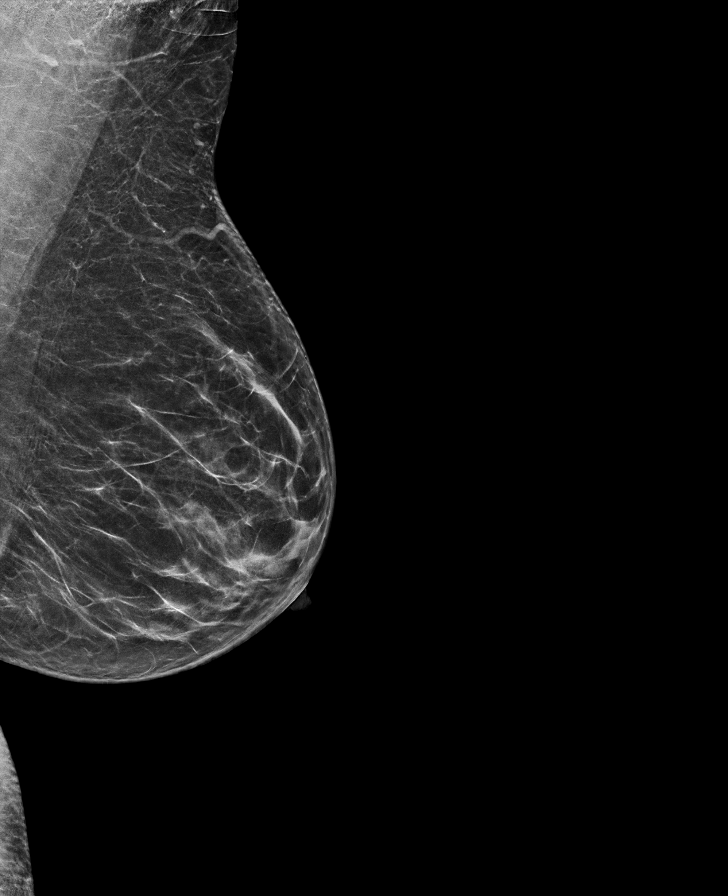

[L CC synth-2D]
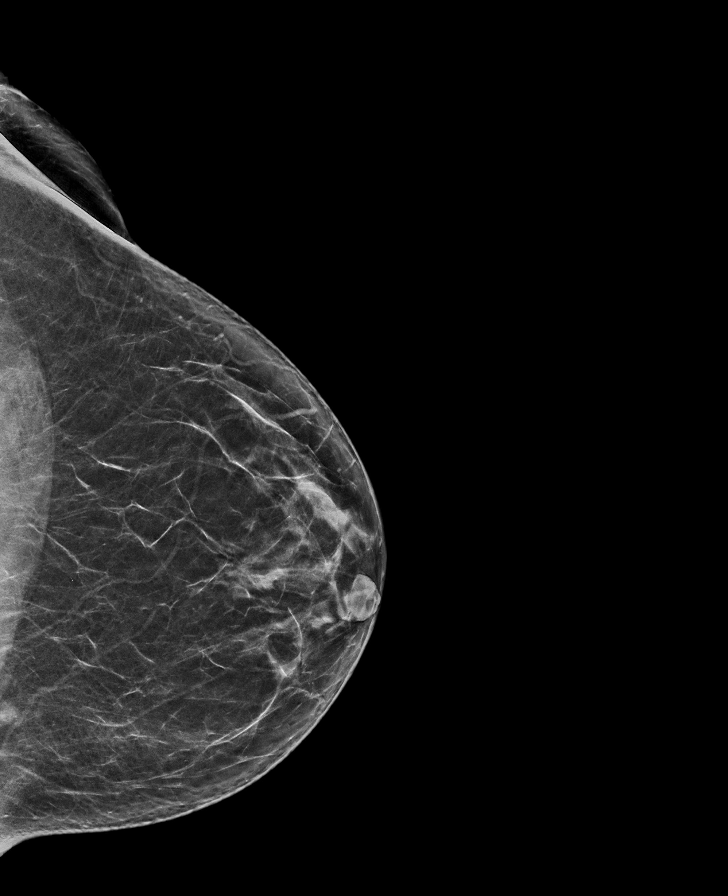

[R MLO]
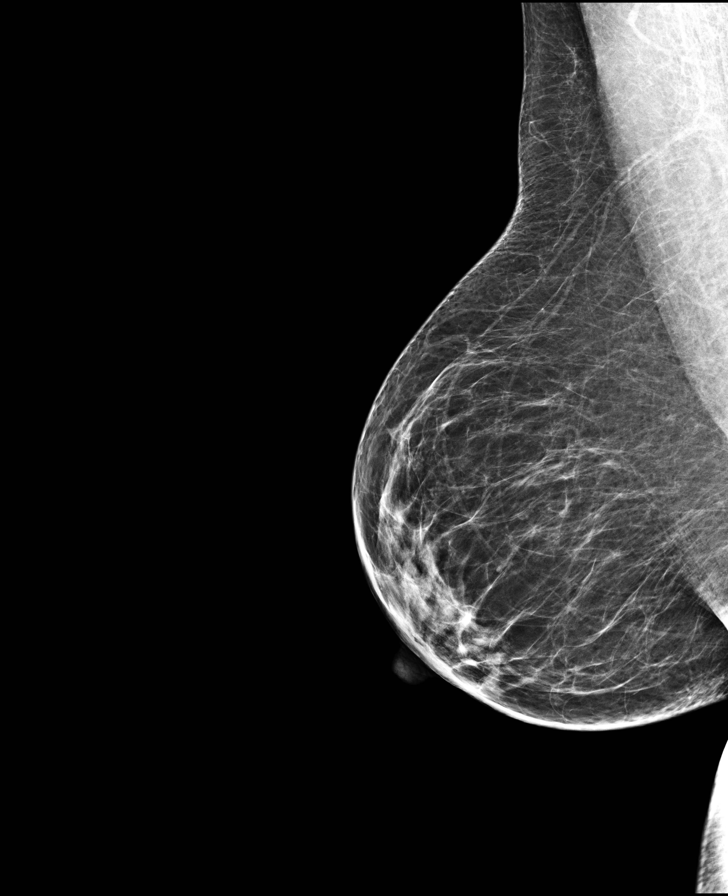

[L MLO]
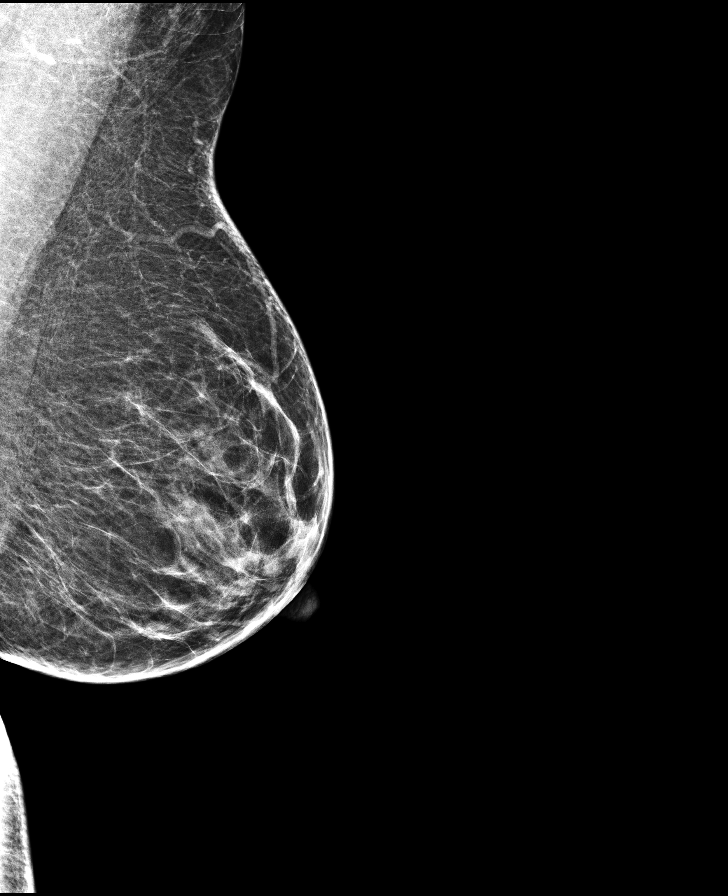

[L CC]
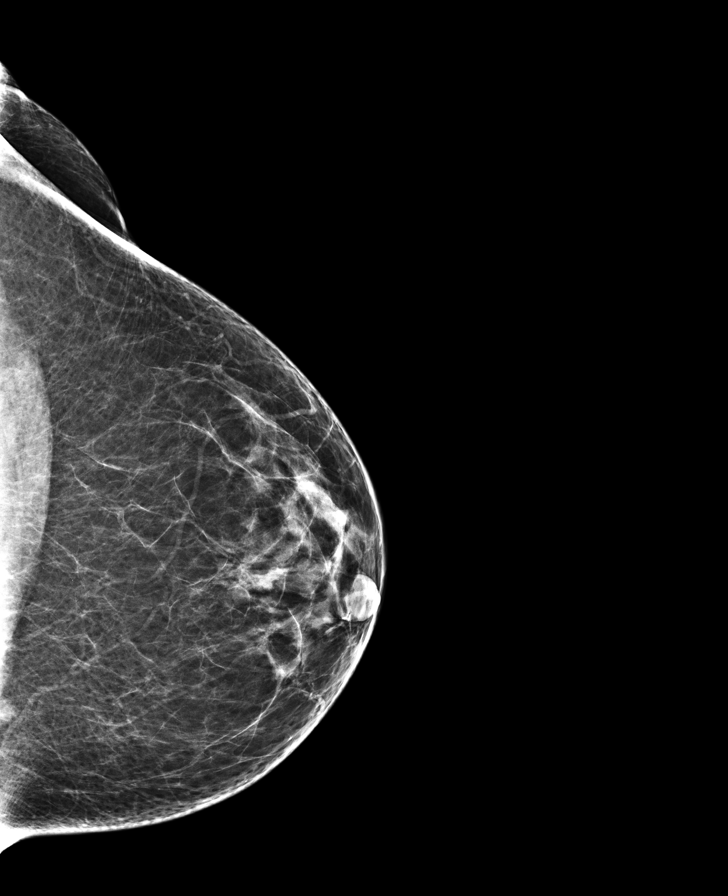

[R MLO synth-2D]
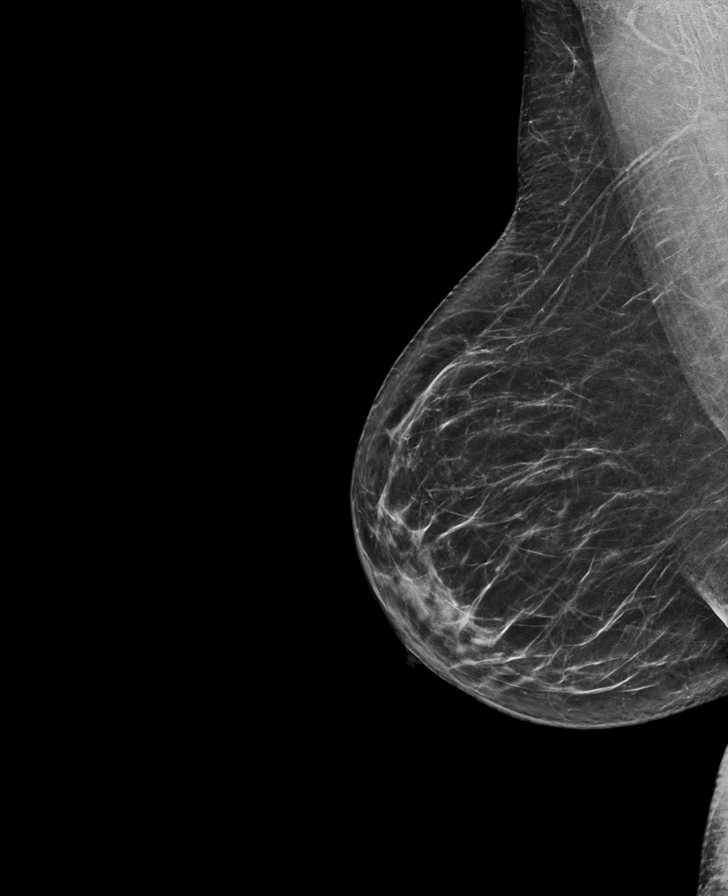

[R CC]
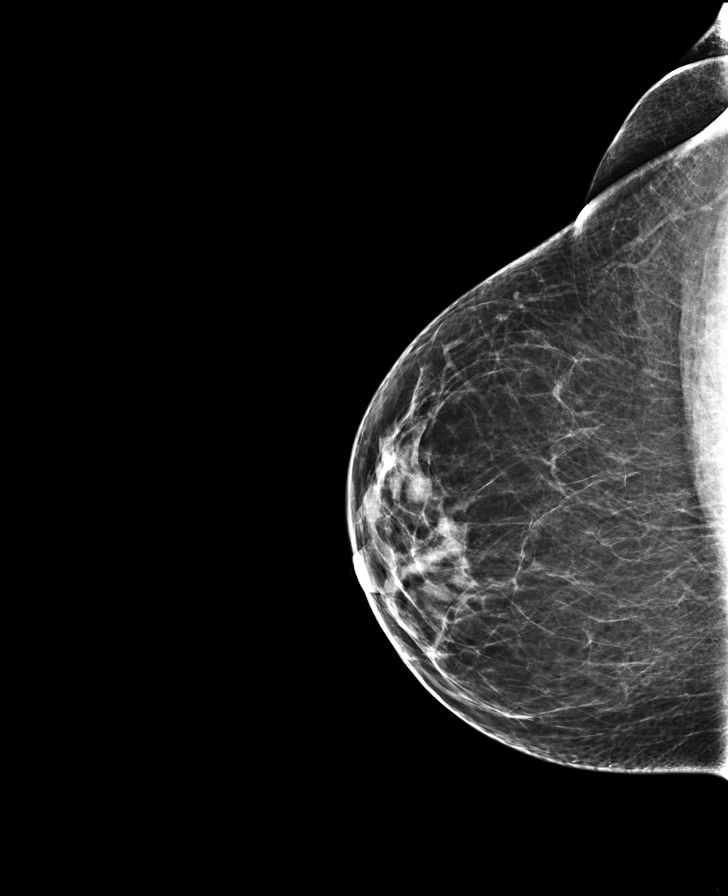

[R CC synth-2D]
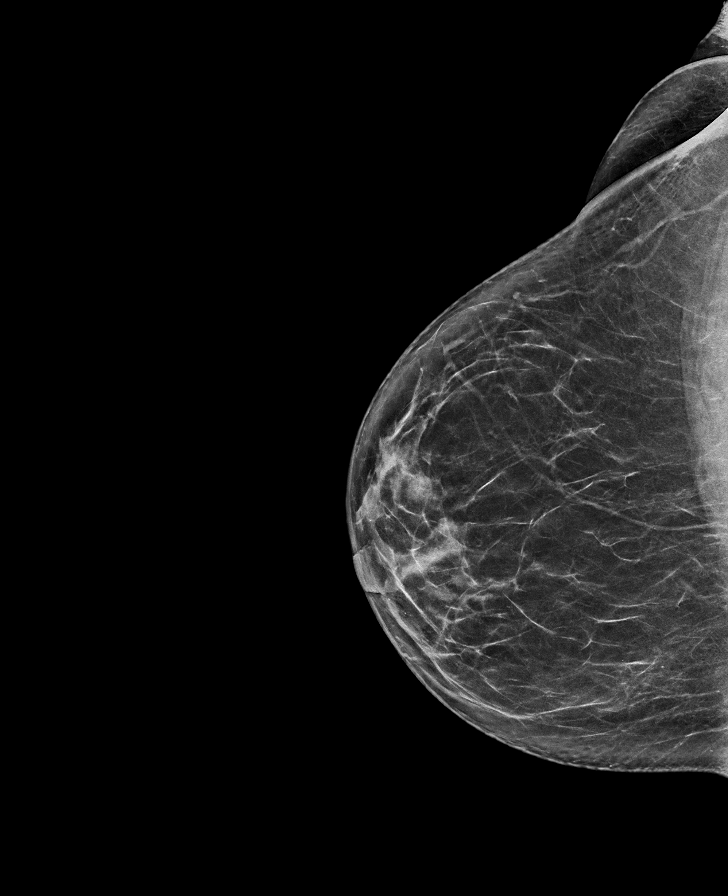

[8 of 28 positions shown; findings below may reference images not displayed]

ACR Breast Density Category b: There are scattered areas of
fibroglandular density.
FINDINGS: There are no findings suspicious for malignancy. Images were
processed with CAD.
IMPRESSION: No mammographic evidence of malignancy. A result letter of this
screening mammogram will be mailed directly to the patient.

RECOMMENDATION:
Screening mammogram in one year. (Code:97-6-RS4)

BI-RADS CATEGORY  1: Negative.

## 2019-01-21 ENCOUNTER — Other Ambulatory Visit: Payer: Self-pay | Admitting: Family Medicine

## 2019-01-21 DIAGNOSIS — Z1231 Encounter for screening mammogram for malignant neoplasm of breast: Secondary | ICD-10-CM

## 2019-02-27 ENCOUNTER — Ambulatory Visit
Admission: RE | Admit: 2019-02-27 | Discharge: 2019-02-27 | Disposition: A | Discharge status: Home / Self Care | Payer: Medicare Other | Source: Ambulatory Visit | Attending: Family Medicine | Admitting: Family Medicine

## 2019-02-27 DIAGNOSIS — Z1231 Encounter for screening mammogram for malignant neoplasm of breast: Secondary | ICD-10-CM

## 2019-12-15 ENCOUNTER — Other Ambulatory Visit: Payer: Self-pay

## 2019-12-15 ENCOUNTER — Emergency Department
Admission: EM | Admit: 2019-12-15 | Discharge: 2019-12-16 | Disposition: A | Discharge status: Home / Self Care | Payer: Medicare PPO | Attending: Emergency Medicine | Admitting: Emergency Medicine

## 2019-12-15 DIAGNOSIS — F17210 Nicotine dependence, cigarettes, uncomplicated: Secondary | ICD-10-CM | POA: Diagnosis not present

## 2019-12-15 DIAGNOSIS — N819 Female genital prolapse, unspecified: Secondary | ICD-10-CM | POA: Diagnosis present

## 2019-12-15 DIAGNOSIS — I10 Essential (primary) hypertension: Secondary | ICD-10-CM | POA: Insufficient documentation

## 2019-12-15 DIAGNOSIS — F10129 Alcohol abuse with intoxication, unspecified: Secondary | ICD-10-CM | POA: Diagnosis not present

## 2019-12-15 DIAGNOSIS — Y907 Blood alcohol level of 200-239 mg/100 ml: Secondary | ICD-10-CM | POA: Insufficient documentation

## 2019-12-15 DIAGNOSIS — Z79899 Other long term (current) drug therapy: Secondary | ICD-10-CM | POA: Insufficient documentation

## 2019-12-15 DIAGNOSIS — Z7982 Long term (current) use of aspirin: Secondary | ICD-10-CM | POA: Diagnosis not present

## 2019-12-15 DIAGNOSIS — F419 Anxiety disorder, unspecified: Secondary | ICD-10-CM | POA: Insufficient documentation

## 2019-12-15 DIAGNOSIS — R45851 Suicidal ideations: Secondary | ICD-10-CM | POA: Diagnosis not present

## 2019-12-15 DIAGNOSIS — F332 Major depressive disorder, recurrent severe without psychotic features: Secondary | ICD-10-CM | POA: Diagnosis present

## 2019-12-15 DIAGNOSIS — F1010 Alcohol abuse, uncomplicated: Secondary | ICD-10-CM

## 2019-12-15 DIAGNOSIS — R3915 Urgency of urination: Secondary | ICD-10-CM | POA: Diagnosis present

## 2019-12-15 DIAGNOSIS — F320 Major depressive disorder, single episode, mild: Secondary | ICD-10-CM | POA: Diagnosis present

## 2019-12-15 LAB — CBC WITH DIFFERENTIAL/PLATELET
Abs Immature Granulocytes: 0.03 10*3/uL (ref 0.00–0.07)
Basophils Absolute: 0.1 10*3/uL (ref 0.0–0.1)
Basophils Relative: 1 %
Eosinophils Absolute: 0.3 10*3/uL (ref 0.0–0.5)
Eosinophils Relative: 3 %
HCT: 44.4 % (ref 36.0–46.0)
Hemoglobin: 15.3 g/dL — ABNORMAL HIGH (ref 12.0–15.0)
Immature Granulocytes: 0 %
Lymphocytes Relative: 27 %
Lymphs Abs: 2.7 10*3/uL (ref 0.7–4.0)
MCH: 30.8 pg (ref 26.0–34.0)
MCHC: 34.5 g/dL (ref 30.0–36.0)
MCV: 89.3 fL (ref 80.0–100.0)
Monocytes Absolute: 0.7 10*3/uL (ref 0.1–1.0)
Monocytes Relative: 7 %
Neutro Abs: 6.2 10*3/uL (ref 1.7–7.7)
Neutrophils Relative %: 62 %
Platelets: 279 10*3/uL (ref 150–400)
RBC: 4.97 MIL/uL (ref 3.87–5.11)
RDW: 14 % (ref 11.5–15.5)
WBC: 9.9 10*3/uL (ref 4.0–10.5)
nRBC: 0 % (ref 0.0–0.2)

## 2019-12-15 LAB — URINE DRUG SCREEN, QUALITATIVE (ARMC ONLY)
Amphetamines, Ur Screen: NOT DETECTED
Barbiturates, Ur Screen: NOT DETECTED
Benzodiazepine, Ur Scrn: NOT DETECTED
Cannabinoid 50 Ng, Ur ~~LOC~~: POSITIVE — AB
Cocaine Metabolite,Ur ~~LOC~~: NOT DETECTED
MDMA (Ecstasy)Ur Screen: NOT DETECTED
Methadone Scn, Ur: NOT DETECTED
Opiate, Ur Screen: NOT DETECTED
Phencyclidine (PCP) Ur S: NOT DETECTED
Tricyclic, Ur Screen: NOT DETECTED

## 2019-12-15 LAB — URINALYSIS, COMPLETE (UACMP) WITH MICROSCOPIC
Bacteria, UA: NONE SEEN
Bilirubin Urine: NEGATIVE
Glucose, UA: NEGATIVE mg/dL
Ketones, ur: NEGATIVE mg/dL
Leukocytes,Ua: NEGATIVE
Nitrite: NEGATIVE
Protein, ur: NEGATIVE mg/dL
Specific Gravity, Urine: 1.003 — ABNORMAL LOW (ref 1.005–1.030)
Squamous Epithelial / HPF: NONE SEEN (ref 0–5)
pH: 6 (ref 5.0–8.0)

## 2019-12-15 LAB — BASIC METABOLIC PANEL
Anion gap: 10 (ref 5–15)
BUN: 15 mg/dL (ref 8–23)
CO2: 30 mmol/L (ref 22–32)
Calcium: 9.8 mg/dL (ref 8.9–10.3)
Chloride: 96 mmol/L — ABNORMAL LOW (ref 98–111)
Creatinine, Ser: 0.84 mg/dL (ref 0.44–1.00)
GFR calc Af Amer: >60 mL/min (ref >60)
GFR calc non Af Amer: >60 mL/min (ref >60)
Glucose, Bld: 95 mg/dL (ref 70–99)
Potassium: 3.7 mmol/L (ref 3.5–5.1)
Sodium: 136 mmol/L (ref 135–145)

## 2019-12-15 LAB — SALICYLATE LEVEL: Salicylate Lvl: <7 mg/dL — ABNORMAL LOW (ref 7.0–30.0)

## 2019-12-15 LAB — ACETAMINOPHEN LEVEL: Acetaminophen (Tylenol), Serum: <10 ug/mL — ABNORMAL LOW (ref 10–30)

## 2019-12-15 LAB — ETHANOL: Alcohol, Ethyl (B): 210 mg/dL — ABNORMAL HIGH (ref <10)

## 2019-12-15 MED ORDER — LORAZEPAM 2 MG PO TABS: 0.0000 mg | ORAL_TABLET | Freq: Two times a day (BID) | ORAL | Status: DC

## 2019-12-15 MED ORDER — LORAZEPAM 2 MG/ML IJ SOLN: 0.0000 mg | Freq: Two times a day (BID) | INTRAMUSCULAR | Status: DC

## 2019-12-15 MED ORDER — LORAZEPAM 2 MG/ML IJ SOLN: 0.0000 mg | Freq: Four times a day (QID) | INTRAMUSCULAR | Status: DC

## 2019-12-15 MED ORDER — THIAMINE HCL 100 MG/ML IJ SOLN: 100.0000 mg | Freq: Every day | INTRAMUSCULAR | Status: DC

## 2019-12-15 MED ORDER — THIAMINE HCL 100 MG PO TABS
100.0000 mg | ORAL_TABLET | Freq: Every day | ORAL | Status: DC
  Administered 2019-12-16: 100 mg via ORAL
  Filled 2019-12-15: qty 1

## 2019-12-15 MED ORDER — LORAZEPAM 2 MG PO TABS: 0.0000 mg | ORAL_TABLET | Freq: Four times a day (QID) | ORAL | Status: DC

## 2019-12-15 NOTE — ED Provider Notes (Signed)
[facility] Emergency Department Provider Note   ____________________________________________   First MD Initiated Contact with Patient 12/15/19 2155     (approximate)  I have reviewed the triage vital signs and the nursing notes.   HISTORY  Chief Complaint Depression    HPI Alison Gonzalez is a 64 y.o. female with past medical history of hypertension, hyperlipidemia, and depression who presents to the ED complaining of depression.  Patient states "I have had a lot of tragedy in my life" and admits to feeling increasingly depressed over the past couple of weeks.  EMS states that she lost a grandchild a few weeks ago and has been making suicidal statements to family since then.  Patient admits to drinking alcohol on a daily basis and estimates she drinks "a couple of liquor drinks" every day.  Her last drink was earlier this evening and EMS found the patient laying down on the ground at her house.  Patient denies falling or hitting her head, denies any medical complaints at this time.  She admits to occasional thoughts of suicide, but states "I would never do anything stupid like that".        Past Medical History:  Diagnosis Date  . Arthritis   . Bronchitis   . Hypercholesteremia   . Hypertension   . Overactive bladder   . Vitamin D deficiency     Patient Active Problem List   Diagnosis Date Noted  . Urgency of urination 02/12/2017  . 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 ANTERIOR AND POSTERIOR REPAIR WITH SACROSPINOUS FIXATION N/A 01/26/2016   Procedure: ANTERIOR AND POSTERIOR REPAIR;  Surgeon: Gae Dry, MD;  Location: [facility] ORS;  Service: Gynecology;  Laterality: N/A;  . CARPAL TUNNEL RELEASE    . CYSTOSCOPY N/A 01/26/2016   Procedure: CYSTOSCOPY;  Surgeon: Gae Dry, MD;  Location: [facility] ORS;  Service: Gynecology;  Laterality: N/A;  . FRACTURE SURGERY    . KNEE ARTHROSCOPY     . PUBOVAGINAL SLING N/A 01/26/2016   Procedure: Gaynelle Arabian;  Surgeon: Gae Dry, MD;  Location: [facility] ORS;  Service: Gynecology;  Laterality: N/A;  . VAGINAL HYSTERECTOMY Bilateral 01/26/2016   Procedure: HYSTERECTOMY VAGINAL/ BSO;  Surgeon: Gae Dry, MD;  Location: [facility] ORS;  Service: Gynecology;  Laterality: Bilateral;    Prior to Admission medications   Medication Sig Start Date End Date Taking? Authorizing Provider  albuterol (PROVENTIL HFA;VENTOLIN HFA) 108 (90 Base) MCG/ACT inhaler Inhale 2 puffs into the lungs every 6 (six) hours as needed for wheezing or shortness of breath.    [provider]  aspirin 81 MG tablet Take 81 mg by mouth daily.    [provider]  atorvastatin (LIPITOR) 40 MG tablet Take 1 tablet by mouth daily. 12/27/15   [provider]  enalapril (VASOTEC) 20 MG tablet Take 20 mg by mouth 2 (two) times daily.    [provider]  hydrochlorothiazide (HYDRODIURIL) 25 MG tablet Take 1 tablet by mouth daily. 12/27/15   [provider]  meloxicam (MOBIC) 15 MG tablet Take 1 tablet by mouth daily as needed for pain.  12/27/15   [provider]  oxyCODONE-acetaminophen (PERCOCET) 5-325 MG tablet Take 1 tablet by mouth every 4 (four) hours as needed for moderate pain or severe pain. 01/26/16   Gae Dry, MD  VITAMIN D, CHOLECALCIFEROL, PO Take 1 tablet by mouth daily.  [provider]    Allergies Patient has no known allergies.  Family History  Problem Relation Age of Onset  . Breast cancer Maternal Aunt   . Breast cancer Maternal Aunt     Social History Social History   Tobacco Use  . Smoking status: Current Some Day Smoker  . Smokeless tobacco: Never Used  Vaping Use  . Vaping Use: Never used  Substance Use Topics  . Alcohol use: Yes    Comment: occ socially  . Drug use: No    Review of Systems  Constitutional: No fever/chills Eyes: No visual changes. ENT: No sore  throat. Cardiovascular: Denies chest pain. Respiratory: Denies shortness of breath. Gastrointestinal: No abdominal pain.  No nausea, no vomiting.  No diarrhea.  No constipation. Genitourinary: Negative for dysuria. Musculoskeletal: Negative for back pain. Skin: Negative for rash. Neurological: Negative for headaches, focal weakness or numbness.  Positive for depression.  ____________________________________________   PHYSICAL EXAM:  VITAL SIGNS: ED Triage Vitals  Enc Vitals Group     BP      Pulse      Resp      Temp      Temp src      SpO2      Weight      Height      Head Circumference      Peak Flow      Pain Score      Pain Loc      Pain Edu?      Excl. in [place]?     Constitutional: Alert and oriented.  Intoxicated appearing.   Eyes: Conjunctivae are normal. Head: Atraumatic. Nose: No congestion/rhinnorhea. Mouth/Throat: Mucous membranes are moist. Neck: Normal ROM Cardiovascular: Normal rate, regular rhythm. Grossly normal heart sounds. Respiratory: Normal respiratory effort.  No retractions. Lungs CTAB. Gastrointestinal: Soft and nontender. No distention. Genitourinary: deferred Musculoskeletal: No lower extremity tenderness nor edema. Neurologic:  Normal speech and language. No gross focal neurologic deficits are appreciated. Skin:  Skin is warm, dry and intact. No rash noted. Psychiatric: Mood and affect are normal. Speech and behavior are normal.  ____________________________________________   LABS (all labs ordered are listed, but only abnormal results are displayed)  Labs Reviewed  CBC WITH DIFFERENTIAL/PLATELET - Abnormal; Notable for the following components:      Result Value   Hemoglobin 15.3 (*)    All other components within normal limits  BASIC METABOLIC PANEL - Abnormal; Notable for the following components:   Chloride 96 (*)    All other components within normal limits  ETHANOL - Abnormal; Notable for the following components:   Alcohol,  Ethyl (B) 210 (*)    All other components within normal limits  URINE DRUG SCREEN, QUALITATIVE ([facility] ONLY) - Abnormal; Notable for the following components:   Cannabinoid 50 Ng, Ur [place] POSITIVE (*)    All other components within normal limits  URINALYSIS, COMPLETE (UACMP) WITH MICROSCOPIC - Abnormal; Notable for the following components:   Color, Urine STRAW (*)    APPearance CLEAR (*)    Specific Gravity, Urine 1.003 (*)    Hgb urine dipstick SMALL (*)    All other components within normal limits  ACETAMINOPHEN LEVEL  SALICYLATE LEVEL   ____________________________________________  EKG  ED ECG REPORT I, Blake Divine, the attending physician, personally viewed and interpreted this ECG.   Date: 12/15/2019  EKG Time: 22:20  Rate: 70  Rhythm: normal sinus rhythm  Axis: Normal  Intervals:right bundle branch block  ST&T Change:  None   PROCEDURES  Procedure(s) performed (including Critical Care):  Procedures   ____________________________________________   INITIAL IMPRESSION / ASSESSMENT AND PLAN / ED COURSE     64 year old female presents to the ED with increasing depression after the recent loss of her grandchild, has been drinking on a daily basis and making threats of ending her life to family.  She arrives intoxicated but denies any medical complaints, no evidence of any traumatic injuries.  EKG shows known right bundle branch block with no acute changes and lab work unremarkable, patient medically cleared for psychiatric evaluation.  Due to suicidal statements, patient was placed under IVC.  We will also place her on CIWA protocol given her regular alcohol consumption.   The patient has been placed in psychiatric observation due to the need to provide a safe environment for the patient while obtaining psychiatric consultation and evaluation, as well as ongoing medical and medication management to treat the patient's condition.  The patient has been placed under full IVC  at this time.       ____________________________________________   FINAL CLINICAL IMPRESSION(S) / ED DIAGNOSES  Final diagnoses:  Suicidal ideation  Alcohol abuse     ED Discharge Orders    None       Note:  This document was prepared using Dragon voice recognition software and may include unintentional dictation errors.   Blake Divine, MD 12/15/19 2253

## 2019-12-15 NOTE — ED Notes (Signed)
No ativan for pt at this time per dr Charna Archer. Pt sleeping in hallway bed.  siderails up x 2.

## 2019-12-15 NOTE — ED Notes (Signed)
EDT Mimi dressed out pt. Pt has black pair of sandals, yellow flower shirt, black pants.

## 2019-12-15 NOTE — ED Notes (Signed)
Dr Charna Archer talked with husband about pt .

## 2019-12-15 NOTE — ED Triage Notes (Signed)
Pt brought in via ems from home. Pt is intoxicated and states she is depressed.   Pt reports her granddaughter died 3 months ago from overdose.  Denies SI or HI.  etoh use and drug use.  Pt in hallway bed.  Pt has slurring of speech.   Pt calm and cooperative.

## 2019-12-15 NOTE — ED Notes (Signed)
Pt reports feeling tired.  Denies si or hi.  Pt drinks almost every day.  Pt admits to smoking pot.  Pt calm and cooperative.

## 2019-12-16 DIAGNOSIS — F332 Major depressive disorder, recurrent severe without psychotic features: Secondary | ICD-10-CM | POA: Diagnosis present

## 2019-12-16 NOTE — ED Notes (Signed)
Pt given personal belongings to change back into pending discharge. This RN spoke with patient's husband Jenny Reichmann and notified of discharge at this time, states is on his way and will be here shortly.

## 2019-12-16 NOTE — ED Notes (Signed)
Report off to vanessa rn 

## 2019-12-16 NOTE — ED Notes (Signed)
This RN to bedside per patient request. Pt requesting her clothes to leave. This RN explained IVC process to patient and that patient pending psych consult. Pt became visibly upset and crying stating, "I don't want to hurt nobody and I don't want to hurt myself". Pt states "I can just leave in this", this RN explained IVC paperwork at this time to patient and that patient is unable to leave without seeing psychiatrist. This RN apologized and explained to patient the need to see psychiatry prior to discharge. Pt states understanding. Pt given OJ per her request. Pt denies further needs at this time. [employer] made aware of patient's threats of eloping, Charge RN made aware of patient's threats of eloping, safety rounder made aware of patient's threats of eloping in burgundy scrubs. Pt appears more calm after being given OJ, laying in bed at this time.

## 2019-12-16 NOTE — ED Notes (Signed)
Pt states to this RN, "have they forgot about me?" This RN explained to patient that psychiatry currently performing rounds on patient at this time and did not forget patient. Pt states understanding. Resting in bed with NAD noted at this time.

## 2019-12-16 NOTE — BH Assessment (Addendum)
Writer spoke with patient's husband Alison Reichmann L.), explained she will remain overnight and discharge in the morning. He understood and voice his agreement with the plan.  Outpatient resources provided to the patient and placed on patient's paper chart.

## 2019-12-16 NOTE — ED Notes (Signed)
Pt eating and drinking po fluids

## 2019-12-16 NOTE — ED Notes (Signed)
NAD noted at time of D/C. Pt ambulatory to the lobby to await her husband's arrival. Paper copy of D/C consent obtained by this RN. Pt denies comments/concerns regarding D/C instructions at this time.

## 2019-12-16 NOTE — Final Progress Note (Signed)
Physician Final Progress Note  Patient ID: KATERRA INGMAN MRN: [id number] DOB/AGE: [birth year] 64 y.o.  Admit date: 12/15/2019 Admitting provider: No admitting provider for patient encounter. Discharge date: 12/16/2019   Admission Diagnoses: Alcohol intoxication,  Bereavement Major depression  Discharge Diagnoses:  Active Problems:   Prolapse of female pelvic organs   Urgency of urination   MDD (major depressive disorder), recurrent episode, severe (HCC)  ETOH  Dependence     Consults:  Psych ED and TTS   Significant Findings/ Diagnostic Studies:Procedures: none  Discharge Condition: stable   Disposition:  home to family   Does not want rehab AA or NA   Diet: as tolerated   Discharge Activity: {   Women's groups,  AA NA --church service work  Outpatient mental health Day treatment IOP   Brief MS   Alert impatient wants to leave Oriented to person place date and time Consciousness not clouded or fluctuant No frank psychosis or mania  No Si HI or plans contracts for safety  Concentration and attention normal  Speech normal  Rapport fair to poor  Looks haggard unkept   IVC rescinded today husband picking her up       Total time spent taking care of this patient: 25-30 min minutes  Signed: Eulas Post 12/16/2019, 10:46 AM

## 2019-12-16 NOTE — ED Provider Notes (Signed)
11:04 AM Patient has been seen and evaluated by psychiatry team and deemed appropriate for discharge. Patient determined not to be a threat to themselves or others. IVC rescinded by psychiatry team. TTS provided resources. Patient is otherwise sober, medically clear, and stable for discharge.     Lilia Pro., MD 12/16/19 [phone]

## 2019-12-16 NOTE — ED Notes (Signed)
PT SLEEPING

## 2019-12-16 NOTE — ED Provider Notes (Signed)
-----------------------------------------   3:20 AM on 12/16/2019 -----------------------------------------  Patient was evaluated by psychiatric NP.  TTS to collect additional collateral information.  Psych reassessment in the morning for likely discharge home.   Paulette Blanch, MD 12/16/19 [phone]

## 2019-12-16 NOTE — Discharge Instructions (Addendum)
Follow-up with your regular doctor and resources provided to you here, regarding your mental health.  Continue taking your medications as prescribed.  Return to the ER for new or recurrent thoughts of suicide, any other thoughts of wanting to hurt yourself or anyone else, or any other new or worsening symptoms that concern you.

## 2019-12-16 NOTE — ED Notes (Signed)
Pt sitting up on side of bed eating breakfast at this time. Pt visualized in NAD at this time.

## 2019-12-16 NOTE — Consult Note (Signed)
[facility] Face-to-Face Psychiatry Consult   Reason for Consult: Depression Referring Physician: Dr. Beather Arbour Patient Identification: Alison Gonzalez MRN:  [id number] Principal Diagnosis: <principal problem not specified> Diagnosis:  Active Problems:   Prolapse of female pelvic organs   Urgency of urination   MDD (major depressive disorder), recurrent episode, severe ([place])   Total Time spent with patient: 30 minutes  Subjective: "I have lost my granddaughter 3 months ago and I am dealing with a lot." Alison Gonzalez is a 64 y.o. female patient presented to [facility] ED via EMS from home and was placed under involuntary commitment status (IVC) by the EDP. The patient is intoxicated and states she is depressed. Her alcohol level is 210 mg/dl; she reports her granddaughter died three months ago from an overdose. The patient is slurring her speech per the triage nurse assessment.   The patient was seen face-to-face by this provider; the chart was reviewed and consulted with Dr. Beather Arbour on 12/16/2019 due to the patient's care. It was discussed with the EDP that the patient does not meet the criteria to be admitted to the psychiatric inpatient unit.  On evaluation, the patient is alert and oriented x 4, calm, sad, blunted, but cooperative, and mood-congruent with affect.  The patient does not appear to be responding to internal or external stimuli. Neither is the patient presenting with any delusional thinking. The patient denies auditory or visual hallucinations. The patient denies any suicidal, homicidal, or self-harm ideations. The patient is not presenting with any psychotic or paranoid behaviors. During an encounter with the patient, she was able to answer questions appropriately.  Plans: The patient is not a safety risk to self or others and does not require psychiatric inpatient admission for stabilization and treatment.  HPI:   Past Psychiatric History:   Risk to Self: Suicidal Ideation: No Suicidal Intent:  No Is patient at risk for suicide?: No Suicidal Plan?: No Access to Means: No What has been your use of drugs/alcohol within the last 12 months?: Alcohol, THC How many times?: 0 Other Self Harm Risks: None reported Triggers for Past Attempts: None known Intentional Self Injurious Behavior: None Risk to Others: Homicidal Ideation: No Thoughts of Harm to Others: No Current Homicidal Intent: No Current Homicidal Plan: No Access to Homicidal Means: No Identified Victim: n/a History of harm to others?: No Assessment of Violence: None Noted Violent Behavior Description: n/a Does patient have access to weapons?: No Criminal Charges Pending?: No Does patient have a court date: No Prior Inpatient Therapy: Prior Inpatient Therapy: No Prior Outpatient Therapy: Prior Outpatient Therapy: Yes Prior Therapy Dates: Unknown Prior Therapy Facilty/Provider(s): [facility] Reason for Treatment: grief Does patient have an ACCT team?: No Does patient have Intensive In-House Services?  : No Does patient have [facility] services? : No Does patient have P4CC services?: No  Past Medical History:  Past Medical History:  Diagnosis Date  . Arthritis   . Bronchitis   . Hypercholesteremia   . Hypertension   . Overactive bladder   . Vitamin D deficiency     Past Surgical History:  Procedure Laterality Date  . ANTERIOR AND POSTERIOR REPAIR WITH SACROSPINOUS FIXATION N/A 01/26/2016   Procedure: ANTERIOR AND POSTERIOR REPAIR;  Surgeon: Gae Dry, MD;  Location: [facility] ORS;  Service: Gynecology;  Laterality: N/A;  . CARPAL TUNNEL RELEASE    . CYSTOSCOPY N/A 01/26/2016   Procedure: CYSTOSCOPY;  Surgeon: Gae Dry, MD;  Location: [facility] ORS;  Service: Gynecology;  Laterality: N/A;  .  FRACTURE SURGERY    . KNEE ARTHROSCOPY    . PUBOVAGINAL SLING N/A 01/26/2016   Procedure: Gaynelle Arabian;  Surgeon: Gae Dry, MD;  Location: [facility] ORS;  Service: Gynecology;  Laterality: N/A;  . VAGINAL  HYSTERECTOMY Bilateral 01/26/2016   Procedure: HYSTERECTOMY VAGINAL/ BSO;  Surgeon: Gae Dry, MD;  Location: [facility] ORS;  Service: Gynecology;  Laterality: Bilateral;   Family History:  Family History  Problem Relation Age of Onset  . Breast cancer Maternal Aunt   . Breast cancer Maternal Aunt    Family Psychiatric  History:  Social History:  Social History   Substance and Sexual Activity  Alcohol Use Yes   Comment: occ socially     Social History   Substance and Sexual Activity  Drug Use No    Social History   Socioeconomic History  . Marital status: Married    Spouse name: Not on file  . Number of children: Not on file  . Years of education: Not on file  . Highest education level: Not on file  Occupational History  . Not on file  Tobacco Use  . Smoking status: Current Some Day Smoker  . Smokeless tobacco: Never Used  Vaping Use  . Vaping Use: Never used  Substance and Sexual Activity  . Alcohol use: Yes    Comment: occ socially  . Drug use: No  . Sexual activity: Not Currently  Other Topics Concern  . Not on file  Social History Narrative  . Not on file   Social Determinants of Health   Financial Resource Strain:   . Difficulty of Paying Living Expenses:   Food Insecurity:   . Worried About Charity fundraiser in the Last Year:   . Arboriculturist in the Last Year:   Transportation Needs:   . Film/video editor (Medical):   Marland Kitchen Lack of Transportation (Non-Medical):   Physical Activity:   . Days of Exercise per Week:   . Minutes of Exercise per Session:   Stress:   . Feeling of Stress :   Social Connections:   . Frequency of Communication with Friends and Family:   . Frequency of Social Gatherings with Friends and Family:   . Attends Religious Services:   . Active Member of Clubs or Organizations:   . Attends Archivist Meetings:   Marland Kitchen Marital Status:    Additional Social History:    Allergies:  No Known Allergies  Labs:   Results for orders placed or performed during the hospital encounter of 12/15/19 (from the past 48 hour(s))  CBC with Differential     Status: Abnormal   Collection Time: 12/15/19 10:03 PM  Result Value Ref Range   WBC 9.9 4.0 - 10.5 K/uL   RBC 4.97 3.87 - 5.11 MIL/uL   Hemoglobin 15.3 (H) 12.0 - 15.0 g/dL   HCT 44.4 36 - 46 %   MCV 89.3 80.0 - 100.0 fL   MCH 30.8 26.0 - 34.0 pg   MCHC 34.5 30.0 - 36.0 g/dL   RDW 14.0 11.5 - 15.5 %   Platelets 279 150 - 400 K/uL   nRBC 0.0 0.0 - 0.2 %   Neutrophils Relative % 62 %   Neutro Abs 6.2 1.7 - 7.7 K/uL   Lymphocytes Relative 27 %   Lymphs Abs 2.7 0.7 - 4.0 K/uL   Monocytes Relative 7 %   Monocytes Absolute 0.7 0 - 1 K/uL   Eosinophils Relative 3 %  Eosinophils Absolute 0.3 0 - 0 K/uL   Basophils Relative 1 %   Basophils Absolute 0.1 0 - 0 K/uL   Immature Granulocytes 0 %   Abs Immature Granulocytes 0.03 0.00 - 0.07 K/uL    Comment: Performed at [facility], [address] [place], [place] [address]  Basic metabolic panel     Status: Abnormal   Collection Time: 12/15/19 10:03 PM  Result Value Ref Range   Sodium 136 135 - 145 mmol/L   Potassium 3.7 3.5 - 5.1 mmol/L   Chloride 96 (L) 98 - 111 mmol/L   CO2 30 22 - 32 mmol/L   Glucose, Bld 95 70 - 99 mg/dL    Comment: Glucose reference range applies only to samples taken after fasting for at least 8 hours.   BUN 15 8 - 23 mg/dL   Creatinine, Ser 0.84 0.44 - 1.00 mg/dL   Calcium 9.8 8.9 - 10.3 mg/dL   GFR calc non Af Amer >60 >60 mL/min   GFR calc Af Amer >60 >60 mL/min   Anion gap 10 5 - 15    Comment: Performed at [facility], [address] [place], [place] [address]  Ethanol     Status: Abnormal   Collection Time: 12/15/19 10:03 PM  Result Value Ref Range   Alcohol, Ethyl (B) 210 (H) <10 mg/dL    Comment: (NOTE) Lowest detectable limit for serum alcohol is 10 mg/dL.  For medical purposes only. Performed at [facility], [address] [place], [place] [address]   Urine Drug Screen, Qualitative     Status: Abnormal   Collection Time: 12/15/19 10:03 PM  Result Value Ref Range   Tricyclic, Ur Screen NONE DETECTED NONE DETECTED   Amphetamines, Ur Screen NONE DETECTED NONE DETECTED   MDMA (Ecstasy)Ur Screen NONE DETECTED NONE DETECTED   Cocaine Metabolite,Ur [place] NONE DETECTED NONE DETECTED   Opiate, Ur Screen NONE DETECTED NONE DETECTED   Phencyclidine (PCP) Ur S NONE DETECTED NONE DETECTED   Cannabinoid 50 Ng, Ur [place] POSITIVE (A) NONE DETECTED   Barbiturates, Ur Screen NONE DETECTED NONE DETECTED   Benzodiazepine, Ur Scrn NONE DETECTED NONE DETECTED   Methadone Scn, Ur NONE DETECTED NONE DETECTED    Comment: (NOTE) Tricyclics + metabolites, urine    Cutoff 1000 ng/mL Amphetamines + metabolites, urine  Cutoff 1000 ng/mL MDMA (Ecstasy), urine              Cutoff 500 ng/mL Cocaine Metabolite, urine          Cutoff 300 ng/mL Opiate + metabolites, urine        Cutoff 300 ng/mL Phencyclidine (PCP), urine         Cutoff 25 ng/mL Cannabinoid, urine                 Cutoff 50 ng/mL Barbiturates + metabolites, urine  Cutoff 200 ng/mL Benzodiazepine, urine              Cutoff 200 ng/mL Methadone, urine                   Cutoff 300 ng/mL  The urine drug screen provides only a preliminary, unconfirmed analytical test result and should not be used for non-medical purposes. Clinical consideration and professional judgment should be applied to any positive drug screen result due to possible interfering substances. A more specific alternate chemical method must be used in order to obtain a confirmed analytical result. Gas chromatography / mass spectrometry (  GC/MS) is the preferred confirm atory method. Performed at [facility], [address] [place], [place] [address]   Urinalysis, Complete w Microscopic     Status: Abnormal   Collection Time: 12/15/19 10:03 PM  Result Value Ref Range   Color, Urine STRAW (A) YELLOW    APPearance CLEAR (A) CLEAR   Specific Gravity, Urine 1.003 (L) 1.005 - 1.030   pH 6.0 5.0 - 8.0   Glucose, UA NEGATIVE NEGATIVE mg/dL   Hgb urine dipstick SMALL (A) NEGATIVE   Bilirubin Urine NEGATIVE NEGATIVE   Ketones, ur NEGATIVE NEGATIVE mg/dL   Protein, ur NEGATIVE NEGATIVE mg/dL   Nitrite NEGATIVE NEGATIVE   Leukocytes,Ua NEGATIVE NEGATIVE   RBC / HPF 0-5 0 - 5 RBC/hpf   WBC, UA 0-5 0 - 5 WBC/hpf   Bacteria, UA NONE SEEN NONE SEEN   Squamous Epithelial / LPF NONE SEEN 0 - 5    Comment: Performed at [facility], [address], [place], [place] [address]  Acetaminophen level     Status: Abnormal   Collection Time: 12/15/19 10:03 PM  Result Value Ref Range   Acetaminophen (Tylenol), Serum <10 (L) 10 - 30 ug/mL    Comment: (NOTE) Therapeutic concentrations vary significantly. A range of 10-30 ug/mL  may be an effective concentration for many patients. However, some  are best treated at concentrations outside of this range. Acetaminophen concentrations >150 ug/mL at 4 hours after ingestion  and >50 ug/mL at 12 hours after ingestion are often associated with  toxic reactions.  Performed at [facility], [address] [place], [place] [address]   Salicylate level     Status: Abnormal   Collection Time: 12/15/19 10:03 PM  Result Value Ref Range   Salicylate Lvl <6.3 (L) 7.0 - 30.0 mg/dL    Comment: Performed at [facility], [address], [place], [place] [address]    Current Facility-Administered Medications  Medication Dose Route Frequency Provider Last Rate Last Admin  . LORazepam (ATIVAN) injection 0-4 mg  0-4 mg Intravenous Q6H Blake Divine, MD       Or  . LORazepam (ATIVAN) tablet 0-4 mg  0-4 mg Oral Q6H Blake Divine, MD      . Derrill Memo ON 12/18/2019] LORazepam (ATIVAN) injection 0-4 mg  0-4 mg Intravenous Q12H Blake Divine, MD       Or  . Derrill Memo ON 12/18/2019] LORazepam (ATIVAN) tablet 0-4 mg  0-4 mg Oral Q12H Blake Divine, MD      . thiamine tablet 100 mg  100 mg Oral Daily Blake Divine, MD       Or  . thiamine (B-1) injection 100 mg  100 mg Intravenous Daily Blake Divine, MD       Current Outpatient Medications  Medication Sig Dispense Refill  . albuterol (PROVENTIL HFA;VENTOLIN HFA) 108 (90 Base) MCG/ACT inhaler Inhale 2 puffs into the lungs every 6 (six) hours as needed for wheezing or shortness of breath.    Marland Kitchen aspirin 81 MG tablet Take 81 mg by mouth daily.    Marland Kitchen atorvastatin (LIPITOR) 40 MG tablet Take 1 tablet by mouth daily.    . enalapril (VASOTEC) 20 MG tablet Take 20 mg by mouth 2 (two) times daily.    . hydrochlorothiazide (HYDRODIURIL) 25 MG tablet Take 1 tablet by mouth daily.    . meloxicam (MOBIC) 15 MG tablet Take 1 tablet by mouth daily as needed for pain.     Marland Kitchen oxyCODONE-acetaminophen (PERCOCET) 5-325 MG  tablet Take 1 tablet by mouth every 4 (four) hours as needed for moderate pain or severe pain. 50 tablet 0  . VITAMIN D, CHOLECALCIFEROL, PO Take 1 tablet by mouth daily.      Musculoskeletal: Strength & Muscle Tone: within normal limits Gait & Station: normal Patient leans: N/A  Psychiatric Specialty Exam: Physical Exam  Constitutional: She is cooperative.  Psychiatric: Her behavior is normal. Judgment and thought content normal. Her affect is blunt. Her speech is delayed. She exhibits a depressed mood.    Review of Systems  Psychiatric/Behavioral: Positive for sleep disturbance. The patient is nervous/anxious.   All other systems reviewed and are negative.   Blood pressure 140/70, pulse 70, temperature 98.3 F (36.8 C), temperature source Oral, resp. rate 20, height 5\' 4"  (1.626 m), weight 72.6 kg, SpO2 99 %.Body mass index is 27.46 kg/m.  General Appearance: Casual  Eye Contact:  Fair  Speech:  Clear and Coherent  Volume:  Decreased  Mood:  Anxious and Depressed  Affect:  Blunt, Congruent, Depressed and Flat  Thought Process:  Coherent  Orientation:  Full  (Time, Place, and Person)  Thought Content:  Logical  Suicidal Thoughts:  No  Homicidal Thoughts:  No  Memory:  Immediate;   Good Recent;   Good Remote;   Good  Judgement:  Fair  Insight:  Fair  Psychomotor Activity:  Normal  Concentration:  Concentration: Fair and Attention Span: Fair  Recall:  Good  Fund of Knowledge:  Good  Language:  Good  Akathisia:  Negative  Handed:  Right  AIMS (if indicated):     Assets:  Communication Skills Desire for Improvement Leisure Time Physical Health Resilience Social Support  ADL's:  Intact  Cognition:  WNL  Sleep:    Insomnia     Treatment Plan Summary: Plan Patient does not meet criteria for psychiatric inpatient admission.  The patient IVC needs to be rescinded and she may be discharged back home  Disposition: No evidence of imminent risk to self or others at present.   Patient does not meet criteria for psychiatric inpatient admission. Supportive therapy provided about ongoing stressors. Refer to IOP. Discussed crisis plan, support from social network, calling 911, coming to the Emergency Department, and calling Suicide Hotline. Patient does not meet criteria for psychiatric inpatient admission.  The patient IVC needs to be rescinded and she may be discharged back home  Caroline Sauger, NP 12/16/2019 4:59 AM

## 2019-12-16 NOTE — ED Notes (Signed)
Psychiatrist at bedside at this time.  

## 2019-12-16 NOTE — ED Notes (Signed)
Pt ambulatory to the bathroom with a steady gait at this time. Pt tolerated well. Pt back to bed without incident at this time.

## 2019-12-16 NOTE — BH Assessment (Signed)
Assessment Note  Alison Gonzalez is an 64 y.o. female. Pt presents to the ED under IVC and was brought in by the ambulance due to being intoxicated and increased depression. Pt stated that she lost a granddaughter in February 2021 which has contributed to her feelings of depression. Pt reported that currently she resides with her husband, granddaughter, daughter, and great granddaughter. Pt reports that she feels increasingly overwhelmed with the responsibility of taking care of her sick daughter and grandchild on top of her feelings of grief. Pt's UDS was positive for THC and her BAL was 210. Pt denied SI/HI/AVH and stated that she is just stressed.  This Probation officer obtained collateral information from the pt's husband Edwina Grossberg) and son Financial risk analyst) who explained the granddaughter found the patient partially conscious, prompting EMS to be called. The pt's son expressed concerns about the pt's drinking and stress levels and felt she needs to be in an outpatient program. Both the husband and son expressed no concerns about the patient's safety and both were in agreement that she could return home.   Diagnosis: Anxiety  Past Medical History:  Past Medical History:  Diagnosis Date  . Arthritis   . Bronchitis   . Hypercholesteremia   . Hypertension   . Overactive bladder   . Vitamin D deficiency     Past Surgical History:  Procedure Laterality Date  . ANTERIOR AND POSTERIOR REPAIR WITH SACROSPINOUS FIXATION N/A 01/26/2016   Procedure: ANTERIOR AND POSTERIOR REPAIR;  Surgeon: Gae Dry, MD;  Location: [facility] ORS;  Service: Gynecology;  Laterality: N/A;  . CARPAL TUNNEL RELEASE    . CYSTOSCOPY N/A 01/26/2016   Procedure: CYSTOSCOPY;  Surgeon: Gae Dry, MD;  Location: [facility] ORS;  Service: Gynecology;  Laterality: N/A;  . FRACTURE SURGERY    . KNEE ARTHROSCOPY    . PUBOVAGINAL SLING N/A 01/26/2016   Procedure: Gaynelle Arabian;  Surgeon: Gae Dry, MD;  Location: [facility] ORS;  Service:  Gynecology;  Laterality: N/A;  . VAGINAL HYSTERECTOMY Bilateral 01/26/2016   Procedure: HYSTERECTOMY VAGINAL/ BSO;  Surgeon: Gae Dry, MD;  Location: [facility] ORS;  Service: Gynecology;  Laterality: Bilateral;    Family History:  Family History  Problem Relation Age of Onset  . Breast cancer Maternal Aunt   . Breast cancer Maternal Aunt     Social History:  reports that she has been smoking. She has never used smokeless tobacco. She reports current alcohol use. She reports that she does not use drugs.  Additional Social History:  Alcohol / Drug Use Pain Medications: See PTA Prescriptions: See PTA History of alcohol / drug use?: Yes Longest period of sobriety (when/how long): Unknown Negative Consequences of Use: Personal relationships Substance #1 Name of Substance 1: Alcohol Substance #2 Name of Substance 2: THC  CIWA: CIWA-Ar BP: 140/70 Pulse Rate: 70 Nausea and Vomiting: no nausea and no vomiting Tactile Disturbances: none Tremor: no tremor Auditory Disturbances: not present Paroxysmal Sweats: no sweat visible Visual Disturbances: not present Anxiety: mildly anxious Headache, Fullness in Head: none present Agitation: normal activity Orientation and Clouding of Sensorium: oriented and can do serial additions CIWA-Ar Total: 1 COWS:    Allergies: No Known Allergies  Home Medications: (Not in a hospital admission)   OB/GYN Status:  No LMP recorded. Patient is postmenopausal.  General Assessment Data Location of Assessment: [facility] ED TTS Assessment: In system Is this a Tele or Face-to-Face Assessment?: Face-to-Face Is this an Initial Assessment or a Re-assessment for this encounter?:  Initial Assessment Patient Accompanied by:: N/A Language Other than English: No Living Arrangements: Other (Comment) What gender do you identify as?: Female Date Telepsych consult ordered in [facility]: 12/15/19 Time Telepsych consult ordered in [facility]: 2252 Marital status:  Married Pregnancy Status: No Living Arrangements: Spouse/significant other, Other relatives, Children Can pt return to current living arrangement?: Yes Admission Status: Involuntary Petitioner: ED Attending Is patient capable of signing voluntary admission?: Yes Referral Source: Self/Family/Friend Insurance type: [employer] Medicare  Medical Screening Exam ([place]) Medical Exam completed: Yes  Crisis Care Plan Living Arrangements: Spouse/significant other, Other relatives, Children Name of Therapist: Services at [facility]  Education Status Is patient currently in school?: No Is the patient employed, unemployed or receiving disability?: Employed  Risk to self with the past 6 months Suicidal Ideation: No Suicidal Intent: No Is patient at risk for suicide?: No Suicidal Plan?: No Has patient had any suicidal plan within the past 6 months prior to admission? : No Access to Means: No What has been your use of drugs/alcohol within the last 12 months?: Alcohol, THC Previous Attempts/Gestures: No How many times?: 0 Other Self Harm Risks: None reported Triggers for Past Attempts: None known Intentional Self Injurious Behavior: None Family Suicide History: Unknown Recent stressful life event(s): Loss (Comment) Persecutory voices/beliefs?: No Depression: Yes Depression Symptoms: Fatigue Substance abuse history and/or treatment for substance abuse?: Yes Suicide prevention information given to non-admitted patients: Not applicable  Risk to Others within the past 6 months Homicidal Ideation: No Does patient have any lifetime risk of violence toward others beyond the six months prior to admission? : No Thoughts of Harm to Others: No Current Homicidal Intent: No Current Homicidal Plan: No Access to Homicidal Means: No Identified Victim: n/a History of harm to others?: No Assessment of Violence: None Noted Violent Behavior Description: n/a Does patient have access to weapons?:  No Criminal Charges Pending?: No Does patient have a court date: No Is patient on probation?: Unknown  Psychosis Hallucinations: None noted Delusions: None noted  Mental Status Report Appearance/Hygiene: Disheveled Eye Contact: Fair Motor Activity: Unremarkable Speech: Slurred Level of Consciousness: Drowsy Mood: Despair, Irritable Affect: Irritable Anxiety Level: Minimal Judgement: Unimpaired Orientation: Person, Place, Time, Situation Obsessive Compulsive Thoughts/Behaviors: None  Cognitive Functioning Concentration: Normal Memory: Recent Intact, Remote Intact Is patient IDD: No Insight: Fair Impulse Control: Fair Appetite: Fair Have you had any weight changes? : No Change Sleep: Unable to Assess Total Hours of Sleep: 0 Vegetative Symptoms: None  ADLScreening [facility] Assessment Services) Patient's cognitive ability adequate to safely complete daily activities?: Yes Patient able to express need for assistance with ADLs?: Yes Independently performs ADLs?: Yes (appropriate for developmental age)  Prior Inpatient Therapy Prior Inpatient Therapy: No  Prior Outpatient Therapy Prior Outpatient Therapy: Yes Prior Therapy Dates: Unknown Prior Therapy [facility]ilty/Provider(s): [facility] Reason for Treatment: grief Does patient have an ACCT team?: No Does patient have Intensive In-House Services?  : No Does patient have [facility] services? : No Does patient have P4CC services?: No  ADL Screening (condition at time of admission) Patient's cognitive ability adequate to safely complete daily activities?: Yes Is the patient deaf or have difficulty hearing?: No Does the patient have difficulty seeing, even when wearing glasses/contacts?: No Does the patient have difficulty concentrating, remembering, or making decisions?: No Patient able to express need for assistance with ADLs?: Yes Does the patient have difficulty dressing or bathing?: No Independently performs ADLs?: Yes  (appropriate for developmental age) Does the patient have difficulty walking or climbing stairs?: No  Weakness of Legs: None Weakness of Arms/Hands: None  Home Assistive Devices/Equipment Home Assistive Devices/Equipment: None  Therapy Consults (therapy consults require a physician order) PT Evaluation Needed: No OT Evalulation Needed: No SLP Evaluation Needed: No   Values / Beliefs Cultural Requests During Hospitalization: None Spiritual Requests During Hospitalization: None Consults Spiritual Care Consult Needed: No Transition of Care Team Consult Needed: No Advance Directives (For Healthcare) Does Patient Have a Medical Advance Directive?: No          Disposition: Pt will be discharged with outpatient resources. Disposition Initial Assessment Completed for this Encounter: Yes  On Site Evaluation by:   Reviewed with Physician:    Kathi Ludwig 12/16/2019 4:04 AM

## 2020-01-26 ENCOUNTER — Other Ambulatory Visit: Payer: Self-pay | Admitting: Family Medicine

## 2020-01-26 DIAGNOSIS — Z1231 Encounter for screening mammogram for malignant neoplasm of breast: Secondary | ICD-10-CM

## 2020-02-29 ENCOUNTER — Other Ambulatory Visit: Payer: Self-pay

## 2020-02-29 ENCOUNTER — Ambulatory Visit
Admission: RE | Admit: 2020-02-29 | Discharge: 2020-02-29 | Disposition: A | Discharge status: Home / Self Care | Payer: Medicare PPO | Source: Ambulatory Visit | Attending: Family Medicine | Admitting: Family Medicine

## 2020-02-29 DIAGNOSIS — Z1231 Encounter for screening mammogram for malignant neoplasm of breast: Secondary | ICD-10-CM

## 2021-01-25 ENCOUNTER — Other Ambulatory Visit: Payer: Self-pay | Admitting: Family Medicine

## 2021-01-25 DIAGNOSIS — Z1231 Encounter for screening mammogram for malignant neoplasm of breast: Secondary | ICD-10-CM

## 2021-03-01 ENCOUNTER — Ambulatory Visit
Admission: RE | Admit: 2021-03-01 | Discharge: 2021-03-01 | Disposition: A | Discharge status: Home / Self Care | Payer: Medicare PPO | Source: Ambulatory Visit | Attending: Family Medicine | Admitting: Family Medicine

## 2021-03-01 ENCOUNTER — Other Ambulatory Visit: Payer: Self-pay

## 2021-03-01 DIAGNOSIS — Z1231 Encounter for screening mammogram for malignant neoplasm of breast: Secondary | ICD-10-CM | POA: Insufficient documentation

## 2022-01-29 ENCOUNTER — Other Ambulatory Visit: Payer: Self-pay | Admitting: Family Medicine

## 2022-01-29 DIAGNOSIS — Z1231 Encounter for screening mammogram for malignant neoplasm of breast: Secondary | ICD-10-CM

## 2022-03-02 ENCOUNTER — Ambulatory Visit
Admission: RE | Admit: 2022-03-02 | Discharge: 2022-03-02 | Disposition: A | Discharge status: Home / Self Care | Payer: Medicare PPO | Source: Ambulatory Visit | Attending: Family Medicine | Admitting: Family Medicine

## 2022-03-02 ENCOUNTER — Encounter: Payer: Self-pay | Admitting: Radiology

## 2022-03-02 DIAGNOSIS — Z1231 Encounter for screening mammogram for malignant neoplasm of breast: Secondary | ICD-10-CM | POA: Diagnosis present

## 2022-03-09 ENCOUNTER — Other Ambulatory Visit: Payer: Self-pay | Admitting: Family Medicine

## 2022-03-09 DIAGNOSIS — Z780 Asymptomatic menopausal state: Secondary | ICD-10-CM

## 2023-01-24 ENCOUNTER — Other Ambulatory Visit: Payer: Self-pay | Admitting: Family Medicine

## 2023-01-24 DIAGNOSIS — Z1231 Encounter for screening mammogram for malignant neoplasm of breast: Secondary | ICD-10-CM

## 2023-03-05 ENCOUNTER — Ambulatory Visit
Admission: RE | Admit: 2023-03-05 | Discharge: 2023-03-05 | Disposition: A | Discharge status: Home / Self Care | Payer: Medicare PPO | Source: Ambulatory Visit | Attending: Family Medicine | Admitting: Family Medicine

## 2023-03-05 DIAGNOSIS — Z1231 Encounter for screening mammogram for malignant neoplasm of breast: Secondary | ICD-10-CM | POA: Diagnosis present

## 2024-01-31 ENCOUNTER — Other Ambulatory Visit: Payer: Self-pay | Admitting: Family Medicine

## 2024-01-31 DIAGNOSIS — Z1231 Encounter for screening mammogram for malignant neoplasm of breast: Secondary | ICD-10-CM

## 2024-03-05 ENCOUNTER — Ambulatory Visit
Admission: RE | Admit: 2024-03-05 | Discharge: 2024-03-05 | Disposition: A | Discharge status: Home / Self Care | Source: Ambulatory Visit | Attending: Family Medicine | Admitting: Family Medicine

## 2024-03-05 DIAGNOSIS — Z1231 Encounter for screening mammogram for malignant neoplasm of breast: Secondary | ICD-10-CM | POA: Diagnosis present
